# Patient Record
Sex: Male | Born: 1980 | Race: White | Hispanic: No | Marital: Married | Smoking: Never smoker
Health system: Southern US, Community
[De-identification: ages and names within clinical notes are randomized; demographics above are authoritative.]

## PROBLEM LIST (undated history)

## (undated) DIAGNOSIS — I480 Paroxysmal atrial fibrillation: Secondary | ICD-10-CM

## (undated) DIAGNOSIS — G4733 Obstructive sleep apnea (adult) (pediatric): Secondary | ICD-10-CM

## (undated) DIAGNOSIS — Z6832 Body mass index (BMI) 32.0-32.9, adult: Secondary | ICD-10-CM

## (undated) DIAGNOSIS — R9389 Abnormal findings on diagnostic imaging of other specified body structures: Secondary | ICD-10-CM

## (undated) DIAGNOSIS — R410 Disorientation, unspecified: Secondary | ICD-10-CM

## (undated) DIAGNOSIS — T7840XA Allergy, unspecified, initial encounter: Secondary | ICD-10-CM

## (undated) HISTORY — DX: Obstructive sleep apnea (adult) (pediatric): G47.33

## (undated) HISTORY — DX: Body mass index (BMI) 32.0-32.9, adult: Z68.32

## (undated) HISTORY — DX: Paroxysmal atrial fibrillation: I48.0

## (undated) HISTORY — PX: NO PAST SURGERIES: SHX2092

## (undated) HISTORY — DX: Allergy, unspecified, initial encounter: T78.40XA

## (undated) HISTORY — DX: Abnormal findings on diagnostic imaging of other specified body structures: R93.89

## (undated) HISTORY — DX: Disorientation, unspecified: R41.0

---

## 2017-07-04 ENCOUNTER — Other Ambulatory Visit: Payer: Self-pay | Admitting: Ophthalmology

## 2017-07-04 DIAGNOSIS — Q078 Other specified congenital malformations of nervous system: Secondary | ICD-10-CM

## 2017-07-15 ENCOUNTER — Ambulatory Visit
Admission: RE | Admit: 2017-07-15 | Discharge: 2017-07-15 | Disposition: A | Discharge status: Home / Self Care | Payer: 59 | Source: Ambulatory Visit | Attending: Ophthalmology | Admitting: Ophthalmology

## 2017-07-15 ENCOUNTER — Ambulatory Visit
Admission: RE | Admit: 2017-07-15 | Discharge: 2017-07-15 | Disposition: A | Discharge status: Home / Self Care | Payer: Self-pay | Source: Ambulatory Visit | Attending: Ophthalmology | Admitting: Ophthalmology

## 2017-07-15 DIAGNOSIS — Q078 Other specified congenital malformations of nervous system: Secondary | ICD-10-CM

## 2017-07-15 MED ORDER — GADOBENATE DIMEGLUMINE 529 MG/ML IV SOLN: 20.0000 mL | Freq: Once | INTRAVENOUS | Status: DC | PRN

## 2017-12-09 DIAGNOSIS — R5383 Other fatigue: Secondary | ICD-10-CM | POA: Insufficient documentation

## 2017-12-09 DIAGNOSIS — F419 Anxiety disorder, unspecified: Secondary | ICD-10-CM | POA: Insufficient documentation

## 2017-12-09 DIAGNOSIS — G479 Sleep disorder, unspecified: Secondary | ICD-10-CM

## 2019-01-09 ENCOUNTER — Other Ambulatory Visit: Payer: Self-pay

## 2019-01-09 ENCOUNTER — Institutional Professional Consult (permissible substitution): Payer: 59 | Admitting: Cardiothoracic Surgery

## 2019-01-09 ENCOUNTER — Encounter: Payer: 59 | Admitting: Cardiothoracic Surgery

## 2019-01-09 ENCOUNTER — Encounter: Payer: Self-pay | Admitting: Cardiothoracic Surgery

## 2019-01-09 DIAGNOSIS — Q248 Other specified congenital malformations of heart: Secondary | ICD-10-CM | POA: Diagnosis not present

## 2019-01-09 DIAGNOSIS — Q341 Congenital cyst of mediastinum: Secondary | ICD-10-CM | POA: Insufficient documentation

## 2019-01-09 HISTORY — DX: Congenital cyst of mediastinum: Q34.1

## 2019-01-09 NOTE — Progress Notes (Signed)
PCP is Everlean CherryWhyte, Thomas M, MD Referring Provider is Everlean CherryWhyte, Thomas M, MD  Chief Complaint  Patient presents with  . Cyst    new patient consultation, pericardial cyst CT abd/pelvis 12/13/2018  Patient examined, images of CT scan personally reviewed and shared with patient  HPI: 38 year old non-smoker good health recently had a CT scan of the abdomen for some left lower abdominal burning pain.  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  Otherwise cardiac structures appear to be normal on the abdominal window.  No other abdominal abnormalities.  The patient presents today for evaluation of the recently diagnosed pericardial cyst.  Patient denies any history of thoracic trauma or disease. 2014 the patient was hospitalized briefly at [facility] for palpitations due to an apparent atrial arrhythmia, atrial fibrillation.  He states he had an echocardiogram at that time which was reported as normal.  Since then he has had no problems with atrial fibrillation and he is not on any medications.  Patient denies any discomfort at all in the area of the right pericardial recess, the site of the cyst.  Patient is a non-smoker. Past Medical History:  Diagnosis Date  . Allergy   . Paroxysmal atrial fibrillation (HCC)     History reviewed. No pertinent surgical history.  Family History  Problem Relation Age of Onset  . Cancer Mother   . Early death Mother   . Hypertension Father   . Hyperlipidemia Father   . Cancer Maternal Aunt   . Heart disease Maternal Aunt   . Hypertension Maternal Grandmother     Social History Social History   Tobacco Use  . Smoking status: Never Smoker  . Smokeless tobacco: Never Used  Substance Use Topics  . Alcohol use: Yes    Comment: Rare, special occasion  . Drug use: Never    No current outpatient medications on file.   No current facility-administered medications for this visit.      No Known Allergies  Review of Systems       Right-hand-dominant    No chest pain or shortness of breath              Review of Systems :  [ y ] = yes, [  ] = no        General :  Weight gain [   ]    Weight loss  [   ]  Fatigue [  ]  Fever [  ]  Chills  [  ]                                          HEENT    Headache [  ]  Dizziness [  ]  Blurred vision [  ] Glaucoma  [  ]                          Nosebleeds [  ] Painful or loose teeth [  ]        Cardiac :  Chest pain/ pressure [  ]  Resting SOB [  ] exertional SOB [  ]                        Orthopnea [  ]  Pedal edema  [  ]  Palpitations [  ] Syncope/presyncope [ ]                         Paroxysmal nocturnal dyspnea [  ]         Pulmonary : cough [  ]  wheezing [  ]  Hemoptysis [  ] Sputum [  ] Snoring [  ]                              Pneumothorax [  ]  Sleep apnea [  ]        GI : Vomiting [  ]  Dysphagia [  ]  Melena  [  ]  Abdominal pain [y left lower abdomen] BRBPR [  ]              Heart burn [  ]  Constipation [  ] Diarrhea  [  ] Colonoscopy [   ]        GU : Hematuria [  ]  Dysuria [  ]  Nocturia [  ] UTI's [  ]        Vascular : Claudication [  ]  Rest pain [  ]  DVT [  ] Vein stripping [  ] leg ulcers [  ]                          TIA [  ] Stroke [  ]  Varicose veins [  ]        NEURO :  Headaches  [  ] Seizures [  ] Vision changes [  ] Paresthesias [  ]                                               Musculoskeletal :  Arthritis [  ] Gout  [  ]  Back pain [  ]  Joint pain [  ]        Skin :  Rash [  ]  Melanoma [  ] Sores [  ]        Heme : Bleeding problems [  ]Clotting Disorders [  ] Anemia [  ]Blood Transfusion [ ]         Endocrine : Diabetes [  ] Heat or Cold intolerance [  ] Polyuria [  ]excessive thirst [ ]         Psych : Depression [  ]  Anxiety [  ]  Psych hospitalizations [  ] Memory change [  ]                                                                            BP 119/84 (BP  Location: Right Arm, Patient Position: Sitting, Cuff Size: Large)   Pulse 84   Resp 18   Ht 5\' 8"  (1.727 m)   Wt 212 lb 6.4 oz (96.3 kg)   SpO2 99% Comment: RA  BMI 32.30 kg/m  Physical Exam      Physical Exam  General: Alert comfortable well-developed middle-aged male no acute distress HEENT: Normocephalic pupils equal , dentition adequate Neck: Supple without JVD, adenopathy, or bruit Chest: Clear to auscultation, symmetrical breath sounds, no rhonchi, no tenderness             or deformity Cardiovascular: Regular rate and rhythm, no murmur, no gallop, peripheral pulses             palpable in all extremities Abdomen:  Soft, nontender, no palpable mass or organomegaly Extremities: Warm, well-perfused, no clubbing cyanosis edema or tenderness,              no venous stasis changes of the legs Rectal/GU: Deferred Neuro: Grossly non--focal and symmetrical throughout Skin: Clean and dry without rash or ulceration   Diagnostic Tests: CT scan images show a smooth ovoid 4 cm cyst in the right pericardiophrenic angle.  This is a small pericardial cyst, asymptomatic.  Impression: Would not recommend resection of this small, benign, pericardial cyst.  Would recommend a follow-up CT scan next year to assess the pericardial cyst for any growth-increase in size.  Patient understands where the abnormality is and will let us know if becomes symptomatic.  Plan: Return with CT scan of chest in 1 year to follow-up small right pericardial cyst.  Mikey Bussing, MD [facility] [phone]

## 2019-01-10 ENCOUNTER — Encounter: Payer: 59 | Admitting: Cardiothoracic Surgery

## 2019-12-11 ENCOUNTER — Other Ambulatory Visit: Payer: Self-pay | Admitting: Cardiothoracic Surgery

## 2019-12-11 DIAGNOSIS — Q248 Other specified congenital malformations of heart: Secondary | ICD-10-CM

## 2020-01-23 ENCOUNTER — Ambulatory Visit: Payer: 59 | Admitting: Cardiothoracic Surgery

## 2020-01-23 ENCOUNTER — Other Ambulatory Visit: Payer: 59

## 2020-02-06 ENCOUNTER — Other Ambulatory Visit: Payer: Self-pay

## 2020-02-06 ENCOUNTER — Ambulatory Visit: Payer: 59 | Admitting: Cardiothoracic Surgery

## 2020-02-06 ENCOUNTER — Ambulatory Visit
Admission: RE | Admit: 2020-02-06 | Discharge: 2020-02-06 | Disposition: A | Discharge status: Home / Self Care | Payer: 59 | Source: Ambulatory Visit | Attending: Cardiothoracic Surgery | Admitting: Cardiothoracic Surgery

## 2020-02-06 ENCOUNTER — Encounter: Payer: Self-pay | Admitting: Cardiothoracic Surgery

## 2020-02-06 VITALS — BP 117/78 | HR 85 | Resp 20 | Ht 68.0 in | Wt 208.0 lb

## 2020-02-06 DIAGNOSIS — Q248 Other specified congenital malformations of heart: Secondary | ICD-10-CM | POA: Diagnosis not present

## 2020-02-06 MED ORDER — IOPAMIDOL (ISOVUE-300) INJECTION 61%
75.0000 mL | Freq: Once | INTRAVENOUS | Status: AC | PRN
  Administered 2020-02-06: 75 mL via INTRAVENOUS

## 2020-02-06 NOTE — Progress Notes (Signed)
PCP is Marylen Ponto, MD Referring Provider is Everlean Cherry, MD  Chief Complaint  Patient presents with  . Follow-up    1 year f/u with Chest CT surveillance on pericardial cyst    HPI: Patient returns for 1 year follow-up with CT scan of the chest for a benign pericardial cyst measuring approximately 5 cm in the right costophrenic region.  This was an incidental finding when he had a CT scan of the abdomen for abdominal complaints.  The cyst remains asymptomatic.  I reviewed the images of the CT scan personally and there is no change in size or morphology of the fluid-filled benign cyst.  He does not want the cyst removed if it is not causing problems.  It is not symptomatic and does not affect his lifestyle.   Past Medical History:  Diagnosis Date  . Allergy   . Paroxysmal atrial fibrillation (HCC)     History reviewed. No pertinent surgical history.  Family History  Problem Relation Age of Onset  . Cancer Mother   . Early death Mother   . Hypertension Father   . Hyperlipidemia Father   . Cancer Maternal Aunt   . Heart disease Maternal Aunt   . Hypertension Maternal Grandmother     Social History Social History   Tobacco Use  . Smoking status: Never Smoker  . Smokeless tobacco: Never Used  Substance Use Topics  . Alcohol use: Yes    Comment: Rare, special occasion  . Drug use: Never    Current Outpatient Medications  Medication Sig Dispense Refill  . ibuprofen (ADVIL) 800 MG tablet Take 800 mg by mouth 3 (three) times daily as needed.    . predniSONE (DELTASONE) 20 MG tablet Take 40 mg by mouth daily.    . sildenafil (REVATIO) 20 MG tablet SMARTSIG:2 Tablet(s) By Mouth As Needed     No current facility-administered medications for this visit.    No Known Allergies  Review of Systems  Negative as noted above No recent episodes of atrial fibrillation Some symptoms of bursitis in his right hip Some seasonal allergy symptoms No presyncope or headache or  change in vision No chest pain or shortness of breath No ankle edema or calf pain  BP 117/78   Pulse 85   Resp 20   Ht 5\' 8"  (1.727 m)   Wt 208 lb (94.3 kg)   SpO2 98% Comment: RA  BMI 31.63 kg/m  Physical Exam      Exam    General- alert and comfortable    Neck- no JVD, no cervical adenopathy palpable, no carotid bruit   Lungs- clear without rales, wheezes   Cor- regular rate and rhythm, no murmur , gallop   Abdomen- soft, non-tender   Extremities - warm, non-tender, minimal edema   Neuro- oriented, appropriate, no focal weakness   Diagnostic Tests: CT images personally reviewed showing no change in the 5 cm x 3 cm right pericardial cyst.  Lungs otherwise clear.  Impression: No indication for surgery.  We will continue to follow with serial CT scans.  Because of the stability over the past year will increase the scan intervals to 18 months.  He will call if he develops symptoms.  Plan: Would not recommend surgery for this benign cyst unless it becomes symptomatic or shows evidence of increase in size on serial scan. Return in 18 months with CT scan of chest.  [place], MD [facility] [phone]

## 2021-05-05 IMAGING — CT CT CHEST W/ CM
1 series · 15 of 33 positions shown, 19 images · IV contrast (APPLIED)
Comparison: Abdominal CT scan 12/13/2018

CLINICAL DATA: Followup pericardial cyst.

EXAM:
CT CHEST WITH CONTRAST
TECHNIQUE: Multidetector CT imaging of the chest was performed during
intravenous contrast administration.
CONTRAST:  75mL 4WWSDN-V22 IOPAMIDOL (4WWSDN-V22) INJECTION 61%

[Series 2: chest w/cm · axial · 0.72mm/px · z∈[-368,-46]mm · 15 of 189 slices shown, 19 images]
[im 14/189  mediastinal]
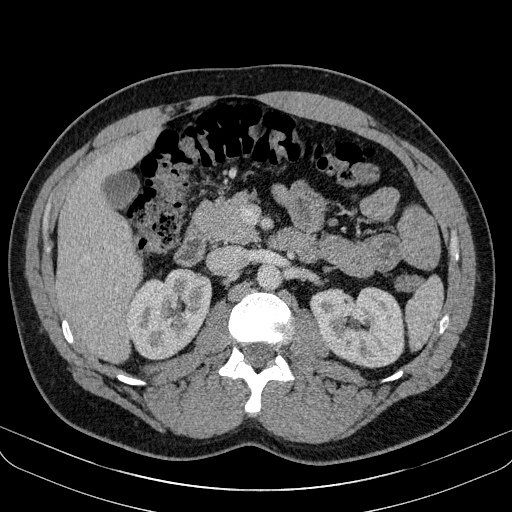
[im 14/189  lung]
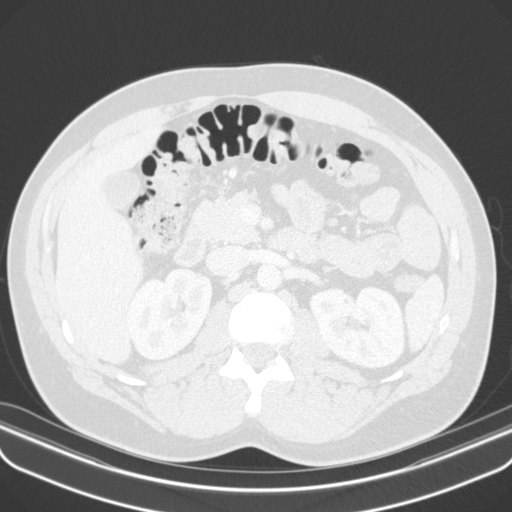
[im 28/189  lung]
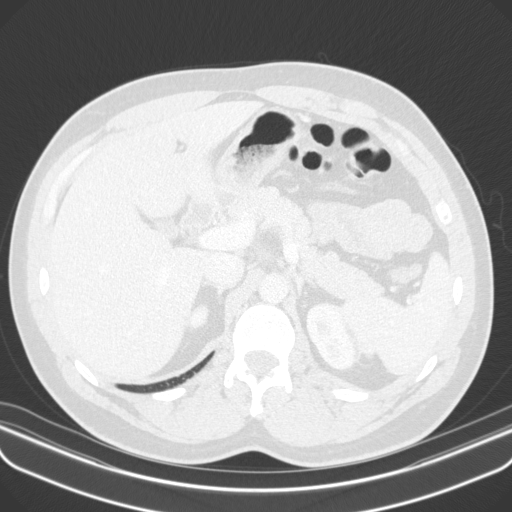
[im 38/189  lung]
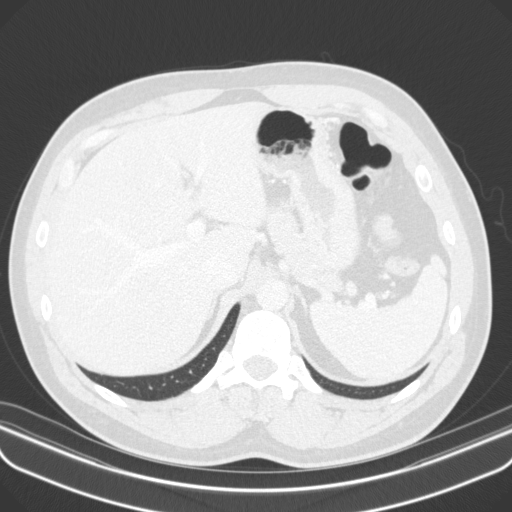
[im 49/189  lung]
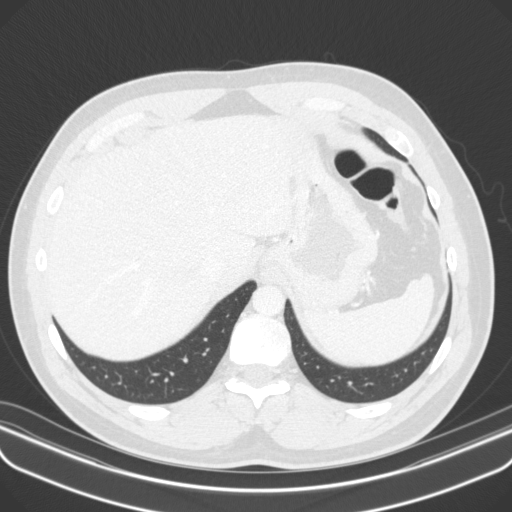
[im 63/189  mediastinal]
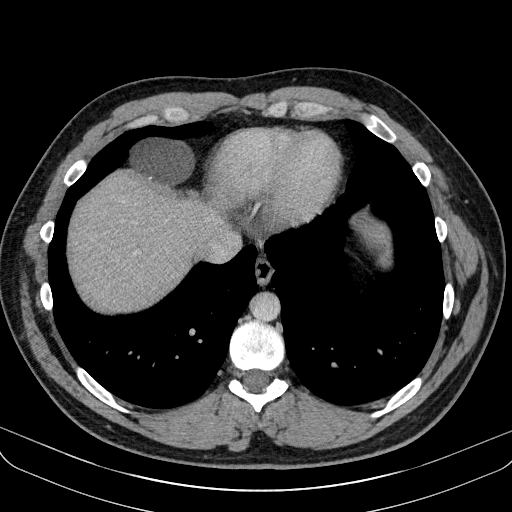
[im 63/189  lung]
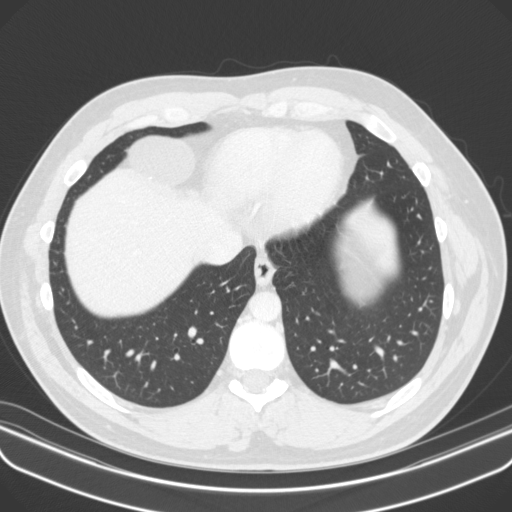
[im 76/189  lung]
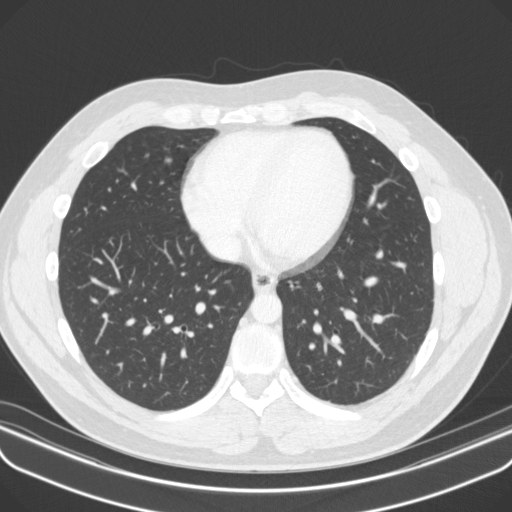
[im 84/189  lung]
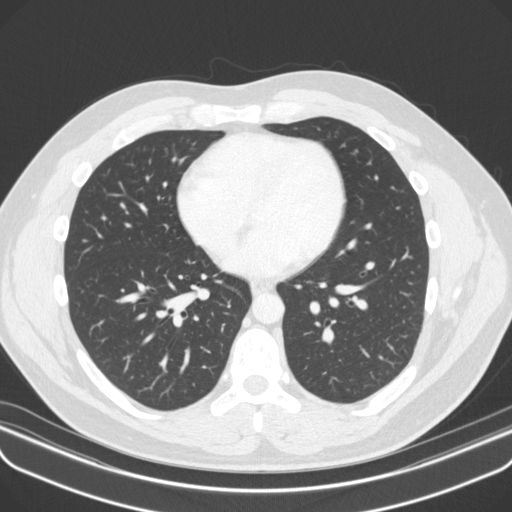
[im 98/189  lung]
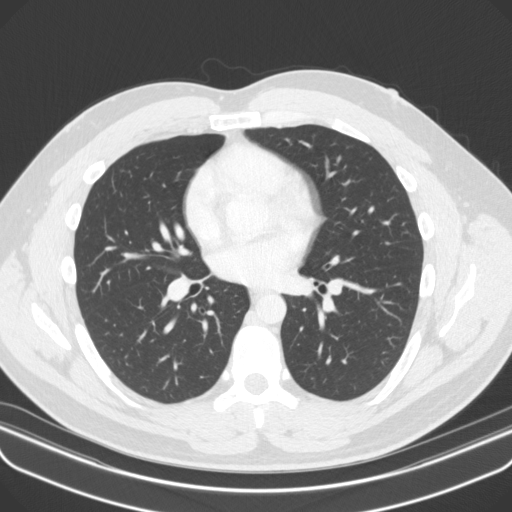
[im 105/189  mediastinal]
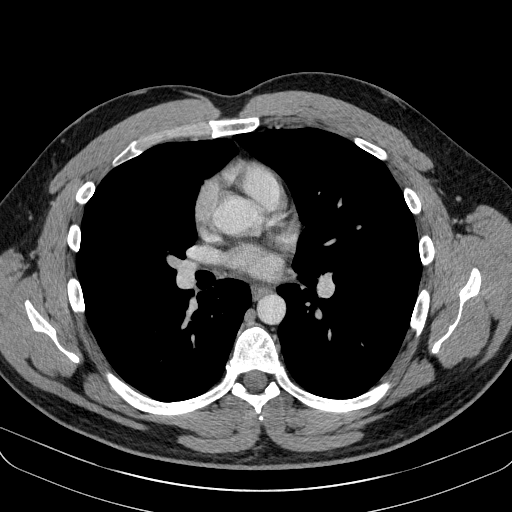
[im 105/189  lung]
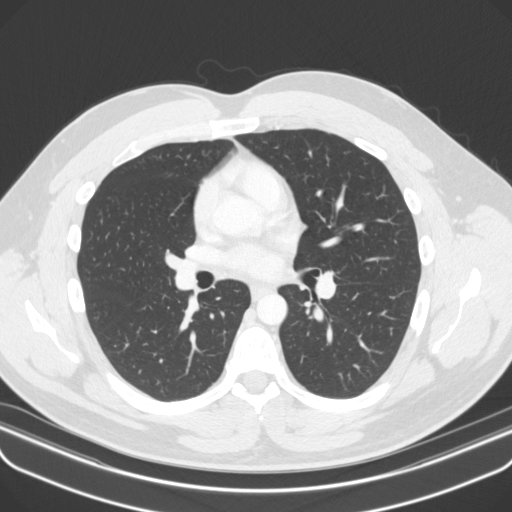
[im 113/189  lung]
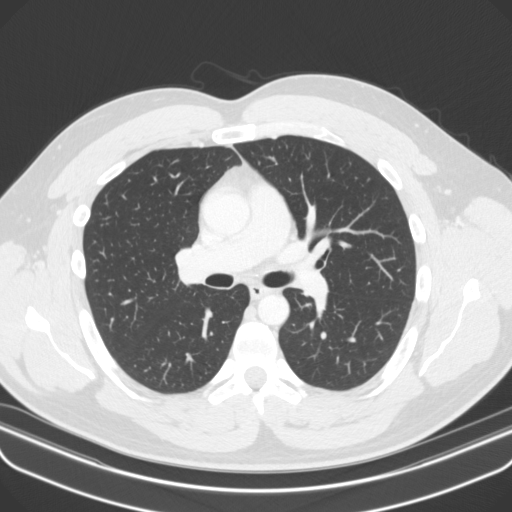
[im 126/189  lung]
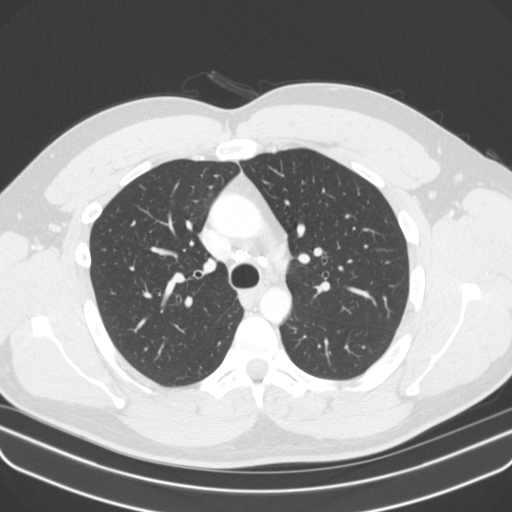
[im 140/189  lung]
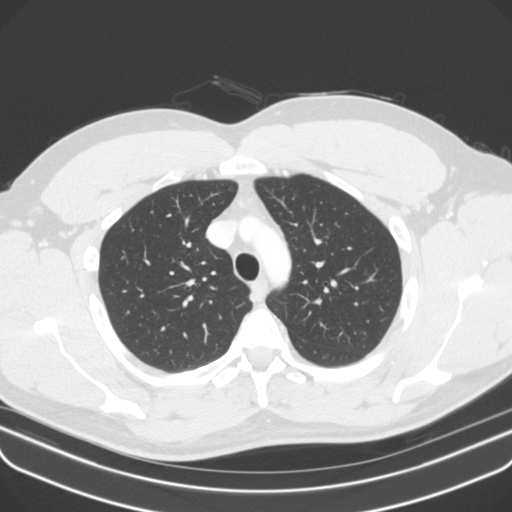
[im 151/189  mediastinal]
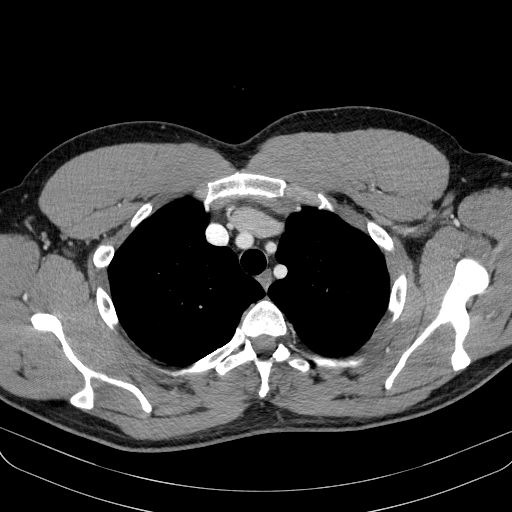
[im 151/189  lung]
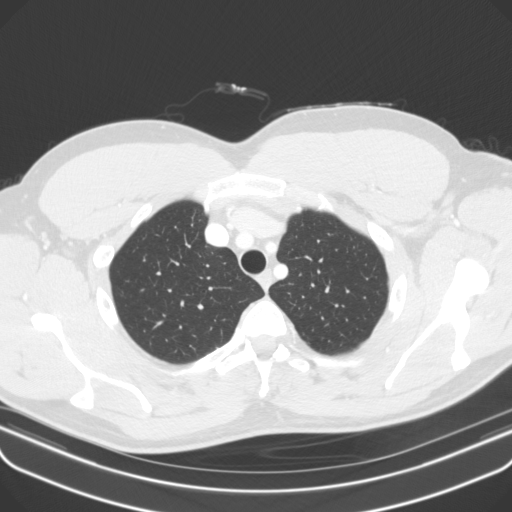
[im 161/189  lung]
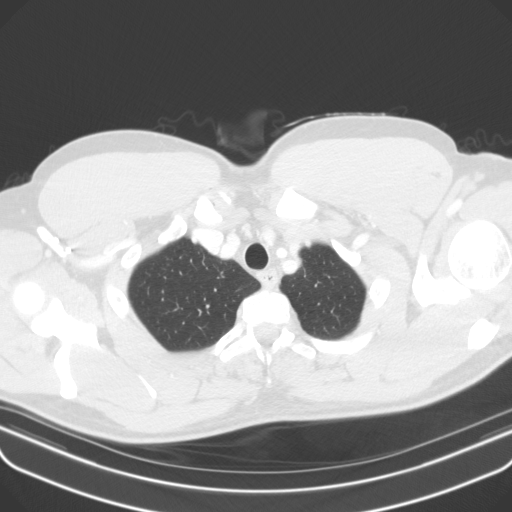
[im 175/189  lung]
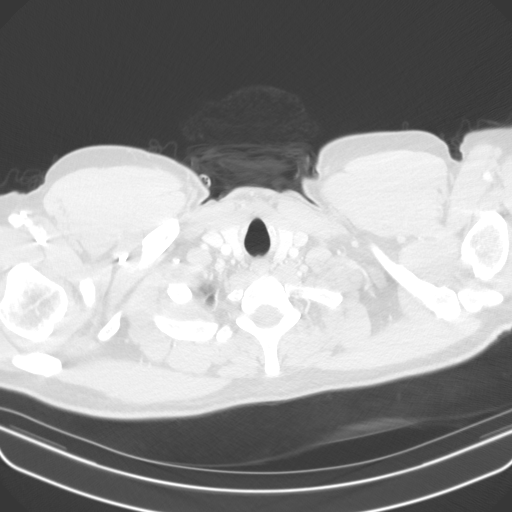

[15 of 33 positions shown; findings below may reference images not displayed]

FINDINGS: Cardiovascular: The heart is normal in size. No pericardial
effusion. The aorta is normal in caliber. No dissection. No
atherosclerotic calcifications. The branch vessels are patent. No
coronary artery calcifications.

Mediastinum/Nodes: Minimal residual thymic tissue noted in the
anterior mediastinum. There are calcified mediastinal lymph nodes.
No mediastinal or hilar mass or adenopathy. The esophagus is
unremarkable.

There is a stable cystic structure just above the right
hemidiaphragm near the right cardiophrenic angle. This measures a
maximum of 4.5 x 3.2 cm and previously measured approximately 4.5 x
3.1 cm. It measures as simple fluid. A few tiny peripheral
calcifications are noted. This is most likely a benign epicardial
cyst.

Lungs/Pleura: The lungs are clear. No acute pulmonary findings or
worrisome pulmonary lesions. No pleural effusion.

Upper Abdomen: No significant upper abdominal findings.

Musculoskeletal: No chest wall mass, supraclavicular or axillary
adenopathy. The thyroid gland appears normal.

The bony thorax is intact.
IMPRESSION: 1. Stable 4.5 x 3.2 cm cystic structure just above the right
hemidiaphragm, most likely a benign epicardial cyst.
2. No acute pulmonary findings or worrisome pulmonary lesions.
3. No mediastinal or hilar mass or adenopathy.
4. Normal vascular structures.

## 2021-07-01 ENCOUNTER — Other Ambulatory Visit: Payer: Self-pay | Admitting: *Deleted

## 2021-07-01 DIAGNOSIS — Q248 Other specified congenital malformations of heart: Secondary | ICD-10-CM

## 2021-08-11 NOTE — Progress Notes (Unsigned)
      [address]       [place] [address]             [phone]                    Worley Radermacher [facility] Medical Record #[id number] Date of Birth: 1981-01-27  Referring: Marylen Ponto, MD Primary Care: Marylen Ponto, MD Primary Cardiologist: None  Chief Complaint:   No chief complaint on file.   History of Present Illness:    Jesus Higgins 40 y.o. male ***    Past Medical History:  Diagnosis Date   Allergy    Paroxysmal atrial fibrillation (HCC)     No past surgical history on file.  Family History  Problem Relation Age of Onset   Cancer Mother    Early death Mother    Hypertension Father    Hyperlipidemia Father    Cancer Maternal Aunt    Heart disease Maternal Aunt    Hypertension Maternal Grandmother      Social History   Tobacco Use  Smoking Status Never  Smokeless Tobacco Never    Social History   Substance and Sexual Activity  Alcohol Use Yes   Comment: Rare, special occasion     No Known Allergies  Current Outpatient Medications  Medication Sig Dispense Refill   ibuprofen (ADVIL) 800 MG tablet Take 800 mg by mouth 3 (three) times daily as needed.     No current facility-administered medications for this visit.    ROS  PHYSICAL EXAMINATION: There were no vitals taken for this visit.  Physical Exam   Diagnostic Studies & Laboratory data:     Recent Radiology Findings:   No results found.     I have independently reviewed the above radiology studies  and reviewed the findings with the patient.   Recent Lab Findings: No results found for: WBC, HGB, HCT, PLT, GLUCOSE, CHOL, TRIG, HDL, LDLDIRECT, LDLCALC, ALT, AST, NA, K, CL, CREATININE, BUN, CO2, TSH, INR, GLUF, HGBA1C     Problem List: Right pericarial cyst  Assessment / Plan:   40 yo male followed by Dr. Donata Clay for a pericardial cyst.   If stable, no need for surgery, unless pt is symptomatic      [name] 08/11/2021 7:50  AM

## 2021-08-14 ENCOUNTER — Encounter: Payer: Self-pay | Admitting: Thoracic Surgery (Cardiothoracic Vascular Surgery)

## 2021-08-14 ENCOUNTER — Inpatient Hospital Stay: Admission: RE | Admit: 2021-08-14 | Payer: Managed Care, Other (non HMO) | Source: Ambulatory Visit

## 2021-11-06 DIAGNOSIS — Z6832 Body mass index (BMI) 32.0-32.9, adult: Secondary | ICD-10-CM | POA: Diagnosis not present

## 2021-11-06 DIAGNOSIS — Z1331 Encounter for screening for depression: Secondary | ICD-10-CM | POA: Diagnosis not present

## 2021-11-06 DIAGNOSIS — R410 Disorientation, unspecified: Secondary | ICD-10-CM | POA: Diagnosis not present

## 2021-11-06 DIAGNOSIS — Z0000 Encounter for general adult medical examination without abnormal findings: Secondary | ICD-10-CM | POA: Diagnosis not present

## 2021-11-06 DIAGNOSIS — Z20828 Contact with and (suspected) exposure to other viral communicable diseases: Secondary | ICD-10-CM | POA: Diagnosis not present

## 2021-11-16 DIAGNOSIS — R410 Disorientation, unspecified: Secondary | ICD-10-CM | POA: Diagnosis not present

## 2021-11-16 DIAGNOSIS — J3489 Other specified disorders of nose and nasal sinuses: Secondary | ICD-10-CM | POA: Diagnosis not present

## 2021-11-28 ENCOUNTER — Encounter (HOSPITAL_COMMUNITY): Payer: Self-pay | Admitting: Emergency Medicine

## 2021-11-28 ENCOUNTER — Emergency Department (HOSPITAL_COMMUNITY)
Admission: EM | Admit: 2021-11-28 | Discharge: 2021-11-28 | Disposition: A | Discharge status: Home / Self Care | Payer: BC Managed Care – PPO | Attending: Student | Admitting: Student

## 2021-11-28 ENCOUNTER — Other Ambulatory Visit: Payer: Self-pay

## 2021-11-28 ENCOUNTER — Emergency Department (HOSPITAL_COMMUNITY): Payer: BC Managed Care – PPO

## 2021-11-28 DIAGNOSIS — Z5321 Procedure and treatment not carried out due to patient leaving prior to being seen by health care provider: Secondary | ICD-10-CM | POA: Insufficient documentation

## 2021-11-28 DIAGNOSIS — R42 Dizziness and giddiness: Secondary | ICD-10-CM | POA: Diagnosis not present

## 2021-11-28 DIAGNOSIS — R0789 Other chest pain: Secondary | ICD-10-CM | POA: Diagnosis not present

## 2021-11-28 DIAGNOSIS — R0602 Shortness of breath: Secondary | ICD-10-CM | POA: Insufficient documentation

## 2021-11-28 DIAGNOSIS — R079 Chest pain, unspecified: Secondary | ICD-10-CM | POA: Diagnosis not present

## 2021-11-28 DIAGNOSIS — R410 Disorientation, unspecified: Secondary | ICD-10-CM | POA: Insufficient documentation

## 2021-11-28 LAB — CBC WITH DIFFERENTIAL/PLATELET
Abs Immature Granulocytes: 0.1 10*3/uL — ABNORMAL HIGH (ref 0.00–0.07)
Basophils Absolute: 0.1 10*3/uL (ref 0.0–0.1)
Basophils Relative: 1 %
Eosinophils Absolute: 0.2 10*3/uL (ref 0.0–0.5)
Eosinophils Relative: 2 %
HCT: 39.5 % (ref 39.0–52.0)
Hemoglobin: 13.7 g/dL (ref 13.0–17.0)
Immature Granulocytes: 1 %
Lymphocytes Relative: 30 %
Lymphs Abs: 2.6 10*3/uL (ref 0.7–4.0)
MCH: 30.6 pg (ref 26.0–34.0)
MCHC: 34.7 g/dL (ref 30.0–36.0)
MCV: 88.2 fL (ref 80.0–100.0)
Monocytes Absolute: 0.6 10*3/uL (ref 0.1–1.0)
Monocytes Relative: 7 %
Neutro Abs: 5.2 10*3/uL (ref 1.7–7.7)
Neutrophils Relative %: 59 %
Platelets: 285 10*3/uL (ref 150–400)
RBC: 4.48 MIL/uL (ref 4.22–5.81)
RDW: 12.5 % (ref 11.5–15.5)
WBC: 8.7 10*3/uL (ref 4.0–10.5)
nRBC: 0 % (ref 0.0–0.2)

## 2021-11-28 LAB — BASIC METABOLIC PANEL
Anion gap: 5 (ref 5–15)
BUN: 19 mg/dL (ref 6–20)
CO2: 29 mmol/L (ref 22–32)
Calcium: 8.7 mg/dL — ABNORMAL LOW (ref 8.9–10.3)
Chloride: 102 mmol/L (ref 98–111)
Creatinine, Ser: 0.93 mg/dL (ref 0.61–1.24)
GFR, Estimated: >60 mL/min (ref >60)
Glucose, Bld: 95 mg/dL (ref 70–99)
Potassium: 3.8 mmol/L (ref 3.5–5.1)
Sodium: 136 mmol/L (ref 135–145)

## 2021-11-28 LAB — TROPONIN I (HIGH SENSITIVITY): Troponin I (High Sensitivity): <2 ng/L (ref <18)

## 2021-11-28 NOTE — ED Triage Notes (Signed)
Pt reports having a cyst in central chest since 2018. Pt reports being dizzy, confused, lightheaded, and SHOB more often. Pt report discomfort in chest yesterday.

## 2021-11-28 NOTE — ED Provider Notes (Signed)
Emergency Medicine Provider Triage Evaluation Note  Jesus Higgins , a 40 y.o. male  was evaluated in triage.  Pt with hx of mediastinal cyst who presents with concern for 24 hours of chest discomfort and SOB, lightheadedness.  Additionally has history of some MRI changes in the brain with MRI last week, following with [facility] neurology for ongoing chronic confusion.  This is not seem to be exacerbated today according to the patient  Review of Systems  Positive: Shortness of breath, lightheadedness, chest discomfort Negative: Syncope, focal weakness  Physical Exam  BP (!) 158/86 (BP Location: Left Arm)    Pulse 85    Resp (!) 105    SpO2 99%  Gen:   Awake, no distress   Resp:  Normal effort  MSK:   Moves extremities without difficulty  Other:  RRR no M/R/G.  Lungs CTA B.  PERRL, EOMI  Medical Decision Making  Medically screening exam initiated at 2:55 PM.  Appropriate orders placed.  Sujay Grundman was informed that the remainder of the evaluation will be completed by another provider, this initial triage assessment does not replace that evaluation, and the importance of remaining in the ED until their evaluation is complete.  This chart was dictated using voice recognition software, Dragon. Despite the best efforts of this provider to proofread and correct errors, errors may still occur which can change documentation meaning.    Paris Lore, PA-C 11/28/21 1457    Mancel Bale, MD 11/28/21 2232

## 2021-11-28 NOTE — ED Notes (Signed)
Patient transported to X-ray 

## 2021-12-09 DIAGNOSIS — I898 Other specified noninfective disorders of lymphatic vessels and lymph nodes: Secondary | ICD-10-CM | POA: Diagnosis not present

## 2021-12-09 DIAGNOSIS — Q248 Other specified congenital malformations of heart: Secondary | ICD-10-CM | POA: Diagnosis not present

## 2021-12-09 DIAGNOSIS — J9859 Other diseases of mediastinum, not elsewhere classified: Secondary | ICD-10-CM | POA: Diagnosis not present

## 2021-12-09 DIAGNOSIS — I318 Other specified diseases of pericardium: Secondary | ICD-10-CM | POA: Diagnosis not present

## 2021-12-09 DIAGNOSIS — R222 Localized swelling, mass and lump, trunk: Secondary | ICD-10-CM | POA: Diagnosis not present

## 2021-12-22 ENCOUNTER — Encounter: Payer: Self-pay | Admitting: *Deleted

## 2021-12-23 ENCOUNTER — Encounter: Payer: Self-pay | Admitting: Psychiatry

## 2021-12-23 ENCOUNTER — Ambulatory Visit (INDEPENDENT_AMBULATORY_CARE_PROVIDER_SITE_OTHER): Payer: BC Managed Care – PPO | Admitting: Psychiatry

## 2021-12-23 VITALS — BP 144/90 | HR 80 | Ht 68.0 in | Wt 220.0 lb

## 2021-12-23 DIAGNOSIS — F419 Anxiety disorder, unspecified: Secondary | ICD-10-CM

## 2021-12-23 DIAGNOSIS — R0683 Snoring: Secondary | ICD-10-CM | POA: Diagnosis not present

## 2021-12-23 DIAGNOSIS — R413 Other amnesia: Secondary | ICD-10-CM | POA: Diagnosis not present

## 2021-12-23 DIAGNOSIS — R4182 Altered mental status, unspecified: Secondary | ICD-10-CM

## 2021-12-23 NOTE — Progress Notes (Signed)
[facility]  PATIENT: Jesus Higgins DOB: 02-14-1981  REFERRING CLINICIAN: Ronita Hipps, MD HISTORY FROM: self and wife REASON FOR VISIT: memory loss   HISTORICAL  CHIEF COMPLAINT:  Chief Complaint  Patient presents with   Memory Loss    RM 1 with spouse [name]  Pt is well,  has been having some confusion/altered memory for about 6 months.  States he forgets more so short term tings, spouse states he is having dizziness and imbalance as well.     HISTORY OF PRESENT ILLNESS:  The patient presents for evaluation of memory loss which has been present over the past 6 months. He cannot identify any trigger for onset of symptoms, though PCP documentation notes he had a COVID infection around this time. Short term memory is most significantly affected. He has good days and bad days. Will have episodes of confusion and forgetfulness which last 1-2 days at a time. Feels like these episodes are getting more frequent. Memory issues are exacerbated by fatigue.   Forgot when it was Christmas Eve last year. He will repeat questions frequently and forget to pick things up from the grocery stores. Has gone to work on his day off because he forgot he was off that day. He is writing everything down that he needs to do for that day so he doesn't forget.  He has also developed lightheadedness, imbalance, and headaches. Lightheadedness usually occurs when he is standing. Headaches are occurring every day and typically resolve with ibuprofen. Denies photophobia and phonophobia, or nausea. He would get intermittent headaches before this, but nothing this frequent.  Had an MRI in 2018 was done for progressively worsening vision. This showed bilateral optic nerve atrophy and bilateral periatrial white matter changes. He was told nothing could be done about the vision/nerve atrophy. Repeat MRI in December 2022 was stable without new hyperintensities or optic nerve changes.   TBI:  Hit  his head 25 years ago and needed stitches Stroke:  no past history of stroke Seizures:  no past history of seizures Sleep: Trouble staying asleep at night due to pain at night. Snores at night. Does not wake up gasping for air.Will doze off during the day. Mood: He has increased anxiety and panic attacks. 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 Never sought treatment for this.  Functional status:  Patient lives with his husband and two children Cooking: He's been cooking the same thing every day, has forgotten the stove was on Cleaning: Cleans around the house without issues Shopping: Forgets to pick things up while shopping Driving: Has not gotten lost while driving, no car accidents Bills: Wife handles finances Medications: Does not take any prescription medications Ever left the stove on by accident?: yes Forget how to use items around the house?: no Getting lost going to familiar places?: no Forgetting loved ones names?: no Word finding difficulty? yes  OTHER MEDICAL CONDITIONS: anxiety, afib not on AC, hx of bilateral optic nerve atrophy   REVIEW OF SYSTEMS: Full 14 system review of systems performed and negative with exception of: memory loss, imbalance, dizziness, headaches  ALLERGIES: No Known Allergies  HOME MEDICATIONS: Outpatient Medications Prior to Visit  Medication Sig Dispense Refill   ibuprofen (ADVIL) 800 MG tablet Take 800 mg by mouth 3 (three) times daily as needed.     No facility-administered medications prior to visit.    PAST MEDICAL HISTORY: Past Medical History:  Diagnosis Date   Abnormal MRI  Allergy    Confusion    Paroxysmal atrial fibrillation (HCC)     PAST SURGICAL HISTORY: History reviewed. No pertinent surgical history.  FAMILY HISTORY: Family History  Problem Relation Age of Onset   Cancer Mother        breast   Early death Mother    Hypertension Father    Hyperlipidemia Father    Hypertension Maternal  Grandmother    Cancer Maternal Aunt    Heart disease Maternal 66   Grandparents had dementia Aunt had strokes  SOCIAL HISTORY: Social History   Socioeconomic History   Marital status: Married    Spouse name: Not on file   Number of children: 2   Years of education: Not on file   Highest education level: Not on file  Occupational History   Not on file  Tobacco Use   Smoking status: Never   Smokeless tobacco: Never  Vaping Use   Vaping Use: Never used  Substance and Sexual Activity   Alcohol use: Yes    Comment: Rare, special occasion   Drug use: Never   Sexual activity: Not on file  Other Topics Concern   Not on file  Social History Narrative   Lives with family   Social Determinants of Health   Financial Resource Strain: Not on file  Food Insecurity: Not on file  Transportation Needs: Not on file  Physical Activity: Not on file  Stress: Not on file  Social Connections: Not on file  Intimate Partner Violence: Not on file     PHYSICAL EXAM  GENERAL EXAM/CONSTITUTIONAL: Vitals:  Vitals:   12/23/21 0817  BP: (!) 144/90  Pulse: 80  Weight: 220 lb (99.8 kg)  Height: 5\' 8"  (1.727 m)   Body mass index is 33.45 kg/m. Wt Readings from Last 3 Encounters:  12/23/21 220 lb (99.8 kg)  02/06/20 208 lb (94.3 kg)  01/09/19 212 lb 6.4 oz (96.3 kg)   Patient is in no distress; well developed, nourished and groomed; neck is supple  CARDIOVASCULAR: Examination of peripheral vascular system by observation and palpation is normal  EYES: Pupils round and reactive to light, Visual fields full to confrontation, Extraocular movements intact   MUSCULOSKELETAL: Gait, strength, tone, movements noted in Neurologic exam below  NEUROLOGIC: MENTAL STATUS:  MMSE - [place] Exam 12/23/2021  Orientation to time 5  Orientation to Place 5  Registration 3  Attention/ Calculation 5  Recall 3  Language- name 2 objects 2  Language- repeat 1  Language- follow 3 step  command 3  Language- read & follow direction 1  Write a sentence 1  Copy design 1  Total score 30    CRANIAL NERVE:  2nd, 3rd, 4th, 6th - pupils equal and reactive to light, visual fields full to confrontation, extraocular muscles intact, no nystagmus 5th - facial sensation symmetric 7th - facial strength symmetric 8th - hearing intact 9th - palate elevates symmetrically, uvula midline 11th - shoulder shrug symmetric 12th - tongue protrusion midline  MOTOR:  normal bulk and tone, no cogwheeling, full strength in the BUE, BLE  SENSORY:  normal and symmetric to light touch all 4 extremities  COORDINATION:  finger-nose-finger, fine finger movements normal, no tremor  REFLEXES:  deep tendon reflexes present and symmetric  GAIT/STATION:  normal     DIAGNOSTIC DATA (LABS, IMAGING, TESTING) - I reviewed patient records, labs, notes, testing and imaging myself where available.  Lab Results  Component Value Date   WBC 8.7 11/28/2021  HGB 13.7 11/28/2021   HCT 39.5 11/28/2021   MCV 88.2 11/28/2021   PLT 285 11/28/2021      Component Value Date/Time   NA 136 11/28/2021 1511   K 3.8 11/28/2021 1511   CL 102 11/28/2021 1511   CO2 29 11/28/2021 1511   GLUCOSE 95 11/28/2021 1511   BUN 19 11/28/2021 1511   CREATININE 0.93 11/28/2021 1511   CALCIUM 8.7 (L) 11/28/2021 1511   GFRNONAA >60 11/28/2021 1511   MRI brain/orbits 2018: Mild periventricular white matter hyperintensity bilaterally which is symmetric. This has been described with optic nerve atrophy. This may be due to chronic ischemia, possibly perinatal ischemia. No acute intracranial abnormality.   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 No mass lesion. Findings suggest optic nerve atrophy bilaterally.  Imaging personally reviewed on 12/23/21   MRI brain 2022:  Stable from 2018 MRI (per report only)  ASSESSMENT AND PLAN  41 y.o. year old male with a history of anxiety, afib not on AC,  bilateral optic nerve atrophy who presents for evaluation of memory and concentration issues over the past 6 months. MRI brain is stable from prior imaging in 2018 with no new findings to explain his symptoms. Will check blood work for reversible causes of memory loss today. Will also check EEG for subclinical seizures as his cognitive changes are episodic with return to baseline in between episodes.   Discussed how mood and sleep issues can contribute to memory loss. Referral to Sleep team placed for evaluation of possible OSA as he endorses snoring at night and daytime fatigue. Psychology referral placed to help with his anxiety.   1. Snoring   2. Anxiety   3. Memory loss   4. Altered mental status, unspecified altered mental status type      PLAN: - Labs: TSH, B12 - Routine EEG - Referral to Sleep for OSA evaluation - Referral to Psychology for anxiety - next steps: consider neuropsych testing if workup unrevealing  Orders Placed This Encounter  Procedures   Vitamin B12   TSH   Ambulatory referral to Neurology   Ambulatory referral to Psychology   EEG adult    No orders of the defined types were placed in this encounter.   Return in about 6 months (around 06/22/2022).  I spent an average of 65 minutes chart reviewing and counseling the patient, with at least 50% of the time face to face with the patient. General brain health measures discussed, including the importance of regular aerobic exercise. Reviewed safety measures including driving safety.   Genia Harold, MD 12/23/20 12:27 PM  [facility] [address] [place], [place] [address] [phone]

## 2021-12-23 NOTE — Patient Instructions (Addendum)
Referral to Sleep for sleep apnea evaluation Referral to psychology for anxiety Blood work - Vitamin B12, TSH 20 minute EEG

## 2021-12-24 LAB — TSH: TSH: 2.67 u[IU]/mL (ref 0.450–4.500)

## 2021-12-24 LAB — VITAMIN B12: Vitamin B-12: 472 pg/mL (ref 232–1245)

## 2021-12-28 ENCOUNTER — Ambulatory Visit (INDEPENDENT_AMBULATORY_CARE_PROVIDER_SITE_OTHER): Payer: BC Managed Care – PPO | Admitting: Neurology

## 2021-12-28 ENCOUNTER — Other Ambulatory Visit: Payer: Self-pay

## 2021-12-28 DIAGNOSIS — R4182 Altered mental status, unspecified: Secondary | ICD-10-CM

## 2021-12-28 NOTE — Procedures (Signed)
° ° °  History:  65 yom with confusion and altered mental status   EEG classification: Awake and drowsy  Description of the recording: The background rhythms of this recording consists of a fairly well modulated medium amplitude alpha rhythm of 11-12 Hz that is reactive to eye opening and closure. As the record progresses, the patient appears to remain in the waking state throughout the recording. Photic stimulation was performed, did not show any abnormalities. Hyperventilation was also performed, did not show any abnormalities. Toward the end of the recording, the patient enters the drowsy state with slight symmetric slowing seen. The patient never enters stage II sleep. There are left temporal sharps and slow waves seen during this recording. There was  left temporal focal slowing. EKG monitor shows no evidence of cardiac rhythm abnormalities with a heart rate of 66.  Impression: This is an abnormal EEG recording in the waking and drowsy state due to:  Left temporal sharp and slow wave  Left temporal focal slowing.   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 No seizures were captured during this recording.    Windell Norfolk, MD [facility]

## 2021-12-29 ENCOUNTER — Other Ambulatory Visit: Payer: Self-pay | Admitting: Psychiatry

## 2021-12-29 MED ORDER — LEVETIRACETAM 500 MG PO TABS: 500.0000 mg | ORAL_TABLET | Freq: Two times a day (BID) | ORAL | 3 refills | Status: DC

## 2021-12-29 NOTE — Progress Notes (Signed)
Called patient to discuss EEG results. 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 Will start Keppra 500 mg BID and see if this reduces his confusional episodes. He will try to get CDs of his most recent MRI to bring to his next visit. Discussed possible LP to assess for underlying causes of new onset seizures and cognitive changes. He would prefer to try medication first, then consider LP if symptoms worsen or do not improve with AEDs.  Victorino Dike Bruin Bolger 12/29/21 12:38 PM

## 2022-01-05 ENCOUNTER — Telehealth: Payer: Self-pay | Admitting: *Deleted

## 2022-01-05 NOTE — Telephone Encounter (Signed)
I looked over his MRI CD and it looks stable from his prior MRI in 2018

## 2022-01-05 NOTE — Telephone Encounter (Signed)
Called patient and informed him his recent MRI is stable from 2018.  He asked about LP. I informed him per her note he wanted to try Keppra first and then if no improvement or worsening of symptoms she would consider LP. He will take keppra for a month then call back to update [place] with his status. I advised his I'll let Dr Delena Bali know of his plan.  Patient verbalized understanding, appreciation.

## 2022-01-05 NOTE — Telephone Encounter (Signed)
Received CD of MRI from patient. Placed on Dr Quentin Mulling desk for review.

## 2022-01-11 ENCOUNTER — Telehealth: Payer: Self-pay | Admitting: Neurology

## 2022-01-11 ENCOUNTER — Ambulatory Visit (INDEPENDENT_AMBULATORY_CARE_PROVIDER_SITE_OTHER): Payer: BC Managed Care – PPO | Admitting: Neurology

## 2022-01-11 ENCOUNTER — Encounter: Payer: Self-pay | Admitting: Neurology

## 2022-01-11 VITALS — BP 129/84 | HR 78 | Ht 68.0 in | Wt 221.0 lb

## 2022-01-11 DIAGNOSIS — G478 Other sleep disorders: Secondary | ICD-10-CM

## 2022-01-11 DIAGNOSIS — R413 Other amnesia: Secondary | ICD-10-CM | POA: Diagnosis not present

## 2022-01-11 DIAGNOSIS — R0683 Snoring: Secondary | ICD-10-CM | POA: Diagnosis not present

## 2022-01-11 DIAGNOSIS — G44229 Chronic tension-type headache, not intractable: Secondary | ICD-10-CM

## 2022-01-11 DIAGNOSIS — H4720 Unspecified optic atrophy: Secondary | ICD-10-CM | POA: Diagnosis not present

## 2022-01-11 NOTE — Addendum Note (Signed)
Addended by: Melvyn Novas on: 01/11/2022 12:29 PM   Modules accepted: Orders

## 2022-01-11 NOTE — Telephone Encounter (Signed)
Referral sent to [facility] [phone]. ?

## 2022-01-11 NOTE — Progress Notes (Addendum)
SLEEP MEDICINE CLINIC    Provider:  Melvyn Novasarmen  Ryen Rhames, MD  Primary Care Physician:  Marylen PontoHolt, Lynley S, MD [address] [place] [phone]     Referring Provider: Ocie Doynehima, Jennifer, Md [address], [address] [place],  [place] [id number]          Chief Complaint according to patient   Patient presents with:     New Patient (Initial Visit)           HISTORY OF PRESENT ILLNESS:  Jesus Higgins is a 41 year -old  male patient  of sic[place] origin , seen seen here  on 01/11/2022 from Dr Delena Balihima for a sleep evaluation. he has balance, memory and fatigue issues.   Chief concern according to patient :  Pt referred by Dr. Delena Balihima for snoring, daytime fatigue and memory loss. Ongoing snoring for years when sleeping on back. No prior SS done. Father has OSA and uses CPAP.  He does not snore when not supine. Wife reports him asleep quickly. He feels very sore and wakes up frequently out of sleep, aching, not able to sleep through the night while not having trouble to initiate sleep     Jesus Higgins  has a past medical history of Abnormal MRI, Allergy, Confusion, and Paroxysmal atrial fibrillation (HCC).   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   Family medical /sleep history: he has a  family member on CPAP with OSA. Marland Kitchen.    Social history:  Patient is working as a Emergency planning/management officerpolice officer. and lives with wife Editor, commissioning( RN ) and [id number] and 42 year old child. 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  Tobacco use-never .   ETOH use - none since 2022.  Caffeine intake in form of Coffee( 16 ounces of coffee in AM ) Soda( 1-2 / week ) or energy drinks. Regular exercise in form of sports- running .     Sleep habits are as follows: The patient's dinner time is between 6-7  PM. The patient goes to bed at 10 PM and continues to sleep for 6 hours, wakes  for one or two bathroom breaks, the first time at 2 AM.   The preferred sleep position is sideways, with the support of 1-2 pillows.  Dreams are reportedly frequent.  5  AM is the usual rise time. The patient wakes up spontaneously before his alarm.  He reports not feeling refreshed or restored in AM, with symptoms such as dry mouth, some morning headaches, and residual fatigue.  Naps are not  taken.  Review of Systems: Out of a complete 14 system review, the patient complains of only the following symptoms, and all other reviewed systems are negative.:  Fatigue, sleepiness , snoring, fragmented sleep,   Impression: This is an abnormal EEG recording in the waking and drowsy state due to:  Left temporal sharp and slow wave  Left temporal focal slowing.    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 No seizures were captured during this recording.     How likely are you to doze in the following situations: 0 = not likely, 1 = slight chance, 2 = moderate chance, 3 = high chance   Sitting and Reading?1 Watching Television?2 Sitting inactive in  a public place (theater or meeting)?1 As a passenger in a car for an hour without a break?1 Lying down in the afternoon when circumstances permit?2 Sitting and talking to someone?0 Sitting quietly after lunch without alcohol?1 In a car, while stopped for a few minutes in traffic?0   Total = 8/ 24 points   FSS endorsed at na/ 63 points.   Social History   Socioeconomic History   Marital status: Married    Spouse name: Melanie   Number of children: 2   Years of education: Not on file   Highest education level: Not on file  Occupational History   Not on file  Tobacco Use   Smoking status: Never   Smokeless tobacco: Never  Vaping Use   Vaping Use: Never used  Substance and Sexual Activity   Alcohol use: Yes     Comment: Rare, special occasion   Drug use: Never   Sexual activity: Not on file  Other Topics Concern   Not on file  Social History Narrative   Lives with family   Right handed   Caffeine: 2 cup of coffee in a day, energy drink 1-2x a week.    Social Determinants of Health   Financial Resource Strain: Not on file  Food Insecurity: Not on file  Transportation Needs: Not on file  Physical Activity: Not on file  Stress: Not on file  Social Connections: Not on file    Family History  Problem Relation Age of Onset   Cancer Mother        breast   Early death Mother    Hypertension Father    Hyperlipidemia Father    Hypertension Maternal Grandmother    Cancer Maternal Aunt    Heart disease Maternal Aunt     Past Medical History:  Diagnosis Date   Abnormal MRI    Allergy    Confusion    Paroxysmal atrial fibrillation (HCC)     History reviewed. No pertinent surgical history.   Current Outpatient Medications on File Prior to Visit  Medication Sig Dispense Refill   ibuprofen (ADVIL) 800 MG tablet Take 800 mg by mouth 3 (three) times daily as needed.     levETIRAcetam (KEPPRA) 500 MG tablet Take 1 tablet (500 mg total) by mouth 2 (two) times daily. 60 tablet 3   No current facility-administered medications on file prior to visit.    No Known Allergies  Physical exam:  Today's Vitals   01/11/22 1052  BP: 129/84  Pulse: 78  Weight: 221 lb (100.2 kg)  Height: 5\' 8"  (1.727 m)   Body mass index is 33.6 kg/m.   Wt Readings from Last 3 Encounters:  01/11/22 221 lb (100.2 kg)  12/23/21 220 lb (99.8 kg)  02/06/20 208 lb (94.3 kg)     Ht Readings from Last 3 Encounters:  01/11/22 5\' 8"  (1.727 m)  12/23/21 5\' 8"  (1.727 m)  02/06/20 5\' 8"  (1.727 m)      General: The patient is awake, alert and appears not in acute distress. The patient is well groomed. Head: Normocephalic, atraumatic. Neck is supple.  Mallampati 2- large tonsils. ,  neck circumference:17  inches . Nasal airflow is patent.  Retrognathia is not seen. Full facial hair.  Dental status: intact  Cardiovascular:  Regular rate and cardiac rhythm by pulse,  without distended neck veins. Respiratory: Lungs are clear to auscultation.  Skin:  Without evidence of ankle edema, or rash. Trunk: The patient's posture is erect.  Neurologic exam : The patient is awake and alert, oriented to place and time.   Memory subjective described as intact.  Attention span & concentration ability appears normal.  Speech is fluent,  without  dysarthria, dysphonia or aphasia.  Mood and affect are appropriate.   Cranial nerves: no loss of smell or taste reported  Pupils are equal and briskly reactive to light. Funduscopic exam deferred. Peripheral vision test is unchanged. Reports  vision is declining at night.  Extraocular movements in vertical and horizontal planes were intact and without nystagmus. No Diplopia. Hearing was intact to soft voice and finger rubbing.    Facial sensation intact to fine touch.  Facial motor strength is symmetric and tongue and uvula move midline.  Neck ROM : rotation, tilt and flexion extension were normal for age and shoulder shrug was symmetrical.    Motor exam:  Symmetric bulk, tone and ROM.   Normal tone without cog wheeling, symmetric grip strength .   Sensory:  Fine touch, pinprick and vibration were tested  and  normal.  Proprioception tested in the upper extremities was normal.   Coordination: Rapid alternating movements in the fingers/hands were of normal speed.  The Finger-to-nose maneuver was intact without evidence of ataxia, dysmetria or tremor.   Gait and station: Patient could rise unassisted from a seated position, walked without assistive device.  Stance is of normal width/ base and the patient turned with 3 steps.  Toe and heel walk were deferred.  Deep tendon reflexes: in the  upper and lower extremities are symmetric and intact.  Babinski response  was deferred.       After spending a total time of  40  minutes , including face to face and additional time for physical and neurologic examination, review of laboratory studies,  personal review of imaging studies, reports and results of other testing and review of referral information / records as far as provided in visit, I have established the following assessments:  1)  abnormal EEG, left asymmetric pattern. 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 Mitochondrial disorder? He also has hearing loss.  3)  fatigue and snoring, possible OSA. Waking up sore. Stiffness, hip pain, shoulder pain.    My Plan is to proceed with:  1)  PSG, wife reports kicking , being restless- this can be due to discomfort, or PLMs disorder. Attended PSG .  2) no microcytic anemia found. I wondered if we should check for thalassemia.   2 b)Consider mitochondrial disorders, in regards to vision and hearing and seizure disorder.  Attended  PSG with normal EEG set up.  Also paroxysmal atrial fib requires a channel for EKG, not provided by a HST. Marland Kitchen    3) LFTs, add vit D. Just OTC supplement.   I would like to thank Marylen Ponto, MD and Ocie Doyne, Md [address] [place],  [place] [address] for allowing me to meet with and to take care of this pleasant patient.   In short, Jesus Higgins is presenting with achiness, soreness, non refreshing sleep and sleep snoring , a symptom that can be attributed to OSA. His study should be attended  for EEG  accompanying his  recording- seizure risk is higher due to abnormal left temporal discharges.  He has a history of paroxysmal Atrial fibrillation.    I plan to follow up either personally or through our NP within 2-4 months.   CC: I will  share my notes with PCP and Dr. Delena Bali.  Electronically signed by: Melvyn Novas, MD 01/11/2022 11:17 AM  [facility] and [facility] Board  certified by The [employer] of Sleep Medicine and Diplomate of the [employer] of Sleep Medicine. Board certified In Neurology through the ABPN, Fellow of the [employer] of Neurology. Medical Director of [facility].

## 2022-01-17 ENCOUNTER — Encounter: Payer: Self-pay | Admitting: Psychiatry

## 2022-01-18 ENCOUNTER — Telehealth: Payer: Self-pay

## 2022-01-18 ENCOUNTER — Other Ambulatory Visit: Payer: Self-pay | Admitting: Psychiatry

## 2022-01-18 DIAGNOSIS — R404 Transient alteration of awareness: Secondary | ICD-10-CM

## 2022-01-18 DIAGNOSIS — R0683 Snoring: Secondary | ICD-10-CM

## 2022-01-18 DIAGNOSIS — R413 Other amnesia: Secondary | ICD-10-CM

## 2022-01-18 NOTE — Telephone Encounter (Signed)
Dr. Vickey Huger has ordered a NPSG for this patient. Unfortunately insurance has denied this request. They have approved for a HST. I need an order for HST please.

## 2022-01-18 NOTE — Telephone Encounter (Signed)
HST order is placed for the pt

## 2022-01-18 NOTE — Addendum Note (Signed)
Addended by: Darleen Crocker on: 01/18/2022 03:59 PM   Modules accepted: Orders

## 2022-01-19 ENCOUNTER — Telehealth: Payer: Self-pay | Admitting: Psychiatry

## 2022-01-19 NOTE — Telephone Encounter (Signed)
MR Brain w/wo contrast Dr. Oda Kilts Berkley Harvey: [id number] (exp. 01/20/22 to 02/17/22)  Patient is scheduled at [facility] for 02/10/22.

## 2022-01-22 DIAGNOSIS — H33323 Round hole, bilateral: Secondary | ICD-10-CM | POA: Diagnosis not present

## 2022-02-08 ENCOUNTER — Ambulatory Visit (INDEPENDENT_AMBULATORY_CARE_PROVIDER_SITE_OTHER): Payer: BC Managed Care – PPO | Admitting: Neurology

## 2022-02-08 DIAGNOSIS — I480 Paroxysmal atrial fibrillation: Secondary | ICD-10-CM

## 2022-02-08 DIAGNOSIS — R0683 Snoring: Secondary | ICD-10-CM

## 2022-02-08 DIAGNOSIS — R9401 Abnormal electroencephalogram [EEG]: Secondary | ICD-10-CM

## 2022-02-08 DIAGNOSIS — R413 Other amnesia: Secondary | ICD-10-CM

## 2022-02-08 DIAGNOSIS — G4733 Obstructive sleep apnea (adult) (pediatric): Secondary | ICD-10-CM

## 2022-02-10 ENCOUNTER — Ambulatory Visit (INDEPENDENT_AMBULATORY_CARE_PROVIDER_SITE_OTHER): Payer: BC Managed Care – PPO

## 2022-02-10 DIAGNOSIS — R404 Transient alteration of awareness: Secondary | ICD-10-CM | POA: Diagnosis not present

## 2022-02-10 MED ORDER — GADOBENATE DIMEGLUMINE 529 MG/ML IV SOLN
20.0000 mL | Freq: Once | INTRAVENOUS | Status: AC | PRN
  Administered 2022-02-10: 20 mL via INTRAVENOUS

## 2022-02-10 NOTE — Progress Notes (Signed)
?  ?  ?[facility] ?  ?HOME SLEEP TEST REPORT ( by Watch PAT)   ?STUDY DATA:  02-10-2022 ?  ?ORDERING CLINICIAN: Carmen Dohmeier, MD  ?REFERRING CLINICIAN: Jennifer Chima, MD  ?  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 He was born prematurely and has a twin brother.  ?He is snoring, has daytime fatigue and memory loss. Ongoing snoring for years when sleeping on back. He has had sinus problems. He does not snore when not supine. Wife reports him to be asleep quickly, but he feels sore and achy . This wakes him up frequently -not able to sleep through the night while not having trouble to initiate sleep. ?Father has OSA and uses CPAP.   ?  ?Epworth sleepiness score: 8/24. ?  ?BMI: 33.4 kg/m? ?  ?Neck Circumference: 17"  ?  ?FINDINGS: ?  ?Sleep Summary: ?  ?Total Recording Time (hours, min): Total recording time amounted to 9 hours and 22 minutes of which 8 hours and 42 minutes were the calculated total sleep time    ?  ?Percent REM (%): 25.5%                                    ?  ?Respiratory Indices: ?  ?Calculated pAHI (per hour):   9.4/h                        ?  ?REM pAHI:    16.7/h                                           ?  ?NREM pAHI:   6.9/h                         ?  ?Positional AHI:    The patient spent most time in supine sleep position with an AHI of 9.5 versus sleep in right lateral sleep position , associated with an AHI of 4.8/h.   ? ?Snoring was loud.  The mean volume was 50 dB and snoring accompanied and 92.4% of total sleep time according to this home sleep test device.  390 minutes of sleep were associated with snoring over 50 dB.   ?This correlates with an RDI (also known as respiratory disturbance index) of 12/h in supine and a 14.5/h while sleeping on the right side.                                           ?  ?Oxygen Saturation Statistics: ?   ?O2 Saturation Range (%):   Oxygen saturation varied between a nadir of 91% and a maximum of 99% saturation with a mean saturation at 95%                                 ?O2 Saturation (minutes) <89%:   0 minutes      ?  ?Pulse Rate Statistics:     ?  ?Pulse Range:      Ranged between a minimum of 55 bpm and a maximum heart rate of 142 bpm with a mean heart rate of 86 bpm. ? ?The lowest and highest heart rate were noticed within REM sleep in right lateral sleep position. This could be a manifestation of atrial fibrillation.          ?  ?IMPRESSION:  This HST confirms the presence of mild, REM sleep dependent obstructive sleep apnea associated with loud and severe snoring.   ?The sleep test  captured tacky and bradycardia during REM sleep, but is not equipped to give information about the cardiac rhythm. ?  ?RECOMMENDATION: In spite of the overall mild sleep apnea there is recent to treat it more aggressively and immediately.  I would strongly recommend positive airway pressure therapy due to the REM dependent character of the sleep apnea and also because there is such a loud snoring recorded. ?I am also concerned about the peak and then drop in heart rate which can indicate seizure activity or paroxysmal atrial fibrillation.  For this reason I would asked the patient to come in for an attended CPAP titration.  This will allow us to fit him for the best mask, see the Pap effect on apnea, cardiac rhythm and snoring.  Limited EEG will also help us to differentiate any seizure activity. ? ?  ?INTERPRETING PHYSICIAN: ? ? Carmen Dohmeier, MD  ? ?Medical Director of [facility].  ? ? ? ? ? ? ? ? ? ? ? ? ? ? ? ? ? ? ? ?

## 2022-02-14 DIAGNOSIS — G4733 Obstructive sleep apnea (adult) (pediatric): Secondary | ICD-10-CM | POA: Insufficient documentation

## 2022-02-14 DIAGNOSIS — R9401 Abnormal electroencephalogram [EEG]: Secondary | ICD-10-CM

## 2022-02-14 DIAGNOSIS — R0683 Snoring: Secondary | ICD-10-CM

## 2022-02-14 DIAGNOSIS — R413 Other amnesia: Secondary | ICD-10-CM

## 2022-02-14 DIAGNOSIS — I480 Paroxysmal atrial fibrillation: Secondary | ICD-10-CM | POA: Insufficient documentation

## 2022-02-14 HISTORY — DX: Snoring: R06.83

## 2022-02-14 HISTORY — DX: Other amnesia: R41.3

## 2022-02-14 HISTORY — DX: Obstructive sleep apnea (adult) (pediatric): G47.33

## 2022-02-14 HISTORY — DX: Abnormal electroencephalogram (EEG): R94.01

## 2022-02-14 NOTE — Addendum Note (Signed)
Addended by: Melvyn Novas on: 02/14/2022 04:28 PM ? ? Modules accepted: Orders ? ?

## 2022-02-14 NOTE — Progress Notes (Signed)
Pulse Range: ???Ranged between a minimum of 55 bpm and a maximum heart rate of 142 bpm with a mean heart rate of 86 bpm. ?? ?The lowest and highest heart rate were noticed within REM sleep in right lateral sleep position.?This could be a manifestation of atrial fibrillation.????????? ?? ?IMPRESSION:  This HST confirms the presence of mild, REM sleep dependent obstructive sleep apnea associated with loud and severe snoring.   ?The sleep test  captured tacky and bradycardia during REM sleep, but is not equipped to give information about the cardiac rhythm. ?? ?RECOMMENDATION: In spite of the overall mild sleep apnea there is recent to treat it more aggressively and immediately.  I would strongly recommend positive airway pressure therapy due to the REM dependent character of the sleep apnea and also because there is such a loud snoring recorded. ?I am also concerned about the peak and then drop in heart rate which can indicate seizure activity or paroxysmal atrial fibrillation.  For this reason I would asked the patient to come in for an attended CPAP titration.  This will allow [place] to fit him for the best mask, see the Pap effect on apnea, cardiac rhythm and snoring.  Limited EEG will also help [place] to differentiate any seizure activity. ??

## 2022-02-14 NOTE — Procedures (Signed)
?  ?  ?[facility] ?  ?HOME SLEEP TEST REPORT ( by Watch PAT)   ?STUDY DATA:  02-10-2022 ?  ?ORDERING CLINICIAN: Melvyn Novas, MD  ?REFERRING CLINICIAN: Ocie Doyne, MD  ?  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 He was born prematurely and has a twin brother.  ?He is snoring, has daytime fatigue and memory loss. Ongoing snoring for years when sleeping on back. He has had sinus problems. He does not snore when not supine. Wife reports him to be asleep quickly, but he feels sore and achy . This wakes him up frequently -not able to sleep through the night while not having trouble to initiate sleep. ?Father has OSA and uses CPAP.   ?  ?Epworth sleepiness score: 8/24. ?  ?BMI: 33.4 kg/m? ?  ?Neck Circumference: 17"  ?  ?FINDINGS: ?  ?Sleep Summary: ?  ?Total Recording Time (hours, min): Total recording time amounted to 9 hours and 22 minutes of which 8 hours and 42 minutes were the calculated total sleep time    ?  ?Percent REM (%): 25.5%                                    ?  ?Respiratory Indices: ?  ?Calculated pAHI (per hour):   9.4/h                        ?  ?REM pAHI:    16.7/h                                           ?  ?NREM pAHI:   6.9/h                         ?  ?Positional AHI:    The patient spent most time in supine sleep position with an AHI of 9.5 versus sleep in right lateral sleep position , associated with an AHI of 4.8/h.   ? ?Snoring was loud.  The mean volume was 50 dB and snoring accompanied and 92.4% of total sleep time according to this home sleep test device.  390 minutes of sleep were associated with snoring over 50 dB.   ?This correlates with an RDI (also known as respiratory disturbance index) of 12/h in supine and a 14.5/h while sleeping on the right side.                                           ?  ?Oxygen Saturation Statistics: ?   ?O2 Saturation Range (%):   Oxygen saturation varied between a nadir of 91% and a maximum of 99% saturation with a mean saturation at 95%                                 ?O2 Saturation (minutes) <89%:   0 minutes      ?  ?Pulse Rate Statistics:     ?  ?Pulse Range:  Ranged between a minimum of 55 bpm and a maximum heart rate of 142 bpm with a mean heart rate of 86 bpm. ? ?The lowest and highest heart rate were noticed within REM sleep in right lateral sleep position. This could be a manifestation of atrial fibrillation.          ?  ?IMPRESSION:  This HST confirms the presence of mild, REM sleep dependent obstructive sleep apnea associated with loud and severe snoring.   ?The sleep test  captured tacky and bradycardia during REM sleep, but is not equipped to give information about the cardiac rhythm. ?  ?RECOMMENDATION: In spite of the overall mild sleep apnea there is recent to treat it more aggressively and immediately.  I would strongly recommend positive airway pressure therapy due to the REM dependent character of the sleep apnea and also because there is such a loud snoring recorded. ?I am also concerned about the peak and then drop in heart rate which can indicate seizure activity or paroxysmal atrial fibrillation.  For this reason I would asked the patient to come in for an attended CPAP titration.  This will allow [place] to fit him for the best mask, see the Pap effect on apnea, cardiac rhythm and snoring.  Limited EEG will also help [place] to differentiate any seizure activity. ? ?  ?INTERPRETING PHYSICIAN: ? ? Melvyn Novas, MD  ? ?Medical Director of [facility].  ? ? ? ? ? ? ? ? ? ? ? ? ? ? ? ? ? ? ? ?

## 2022-02-15 ENCOUNTER — Telehealth: Payer: Self-pay

## 2022-02-15 ENCOUNTER — Other Ambulatory Visit: Payer: Self-pay | Admitting: Psychiatry

## 2022-02-15 ENCOUNTER — Encounter: Payer: Self-pay | Admitting: Psychiatry

## 2022-02-15 ENCOUNTER — Telehealth: Payer: Self-pay | Admitting: Neurology

## 2022-02-15 DIAGNOSIS — R0683 Snoring: Secondary | ICD-10-CM

## 2022-02-15 DIAGNOSIS — G478 Other sleep disorders: Secondary | ICD-10-CM

## 2022-02-15 MED ORDER — LAMOTRIGINE 25 MG PO TABS: ORAL_TABLET | ORAL | 0 refills | Status: DC

## 2022-02-15 NOTE — Telephone Encounter (Signed)

## 2022-02-15 NOTE — Telephone Encounter (Signed)
Insurance has denied CPAP titration sleep study. Pt has to try autoPAP first.  ?

## 2022-02-15 NOTE — Telephone Encounter (Signed)
-----   Message from Melvyn Novas, MD sent at 02/14/2022  4:27 PM EDT ----- ?Pulse Range: ???Ranged between a minimum of 55 bpm and a maximum heart rate of 142 bpm with a mean heart rate of 86 bpm. ?? ?The lowest and highest heart rate were noticed within REM sleep in right lateral sleep position.?This could be a manifestation of atrial fibrillation.????????? ?? ?IMPRESSION:  This HST confirms the presence of mild, REM sleep dependent obstructive sleep apnea associated with loud and severe snoring.   ?The sleep test  captured tacky and bradycardia during REM sleep, but is not equipped to give information about the cardiac rhythm. ?? ?RECOMMENDATION: In spite of the overall mild sleep apnea there is recent to treat it more aggressively and immediately.  I would strongly recommend positive airway pressure therapy due to the REM dependent character of the sleep apnea and also because there is such a loud snoring recorded. ?I am also concerned about the peak and then drop in heart rate which can indicate seizure activity or paroxysmal atrial fibrillation.  For this reason I would asked the patient to come in for an attended CPAP titration.  This will allow [place] to fit him for the best mask, see the Pap effect on apnea, cardiac rhythm and snoring.  Limited EEG will also help [place] to differentiate any seizure activity. ?? ?

## 2022-02-16 NOTE — Telephone Encounter (Signed)
Addressed this information and next steps in previous sleep result phone note ?

## 2022-02-16 NOTE — Telephone Encounter (Signed)
"  Insurance has denied CPAP titration sleep study. Pt has to try autoPAP first." ? ?I called pt. I advised pt that Dr. Vickey Huger reviewed their sleep study results and found that pt has osa. Since insurance will not cover the titration study, Dr. Vickey Huger recommends that pt start auto CPAP. I reviewed PAP compliance expectations with the pt. Pt is agreeable to starting a CPAP. I advised pt that an order will be sent to a DME, [facility], and [facility] will call the pt within about one week after they file with the pt's insurance. [facility] will show the pt how to use the machine, fit for masks, and troubleshoot the CPAP if needed. A follow up appt was made for insurance purposes with Shawnie Dapper, NP on May 29,2023 at 8 am. Pt verbalized understanding to arrive 15 minutes early and bring their CPAP. A letter with all of this information in it will be mailed to the pt as a reminder. I verified with the pt that the address we have on file is correct. Pt verbalized understanding of results. Pt had no questions at this time but was encouraged to call back if questions arise. I have sent the order to [facility] and have received confirmation that they have received the order. ? ? ?

## 2022-02-16 NOTE — Addendum Note (Signed)
Addended by: Judi Cong on: 02/16/2022 08:31 AM ? ? Modules accepted: Orders ? ?

## 2022-02-19 ENCOUNTER — Ambulatory Visit (INDEPENDENT_AMBULATORY_CARE_PROVIDER_SITE_OTHER): Payer: BC Managed Care – PPO | Admitting: Licensed Clinical Social Worker

## 2022-02-19 ENCOUNTER — Other Ambulatory Visit: Payer: Self-pay

## 2022-02-19 ENCOUNTER — Encounter (HOSPITAL_COMMUNITY): Payer: Self-pay

## 2022-02-19 DIAGNOSIS — F410 Panic disorder [episodic paroxysmal anxiety] without agoraphobia: Secondary | ICD-10-CM

## 2022-02-19 NOTE — Plan of Care (Signed)
?  Problem: Anxiety Disorder CCP Problem  1 Managing panic attacks  ?Goal:  Alinda Money will manage anxiety as evidenced by managing physical symptoms of panic attacks, identifying internal and external cues, and challenging anxious thoughts for 5 out of 7 days for 60 days.  ?Outcome: Not Progressing ?Goal: STG: Patient will practice problem solving skills 3 times per week for the next 4 weeks ?Outcome: Not Progressing ?  ?
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
## 2022-03-11 DIAGNOSIS — G4733 Obstructive sleep apnea (adult) (pediatric): Secondary | ICD-10-CM | POA: Diagnosis not present

## 2022-03-12 ENCOUNTER — Ambulatory Visit (HOSPITAL_COMMUNITY): Payer: PRIVATE HEALTH INSURANCE | Admitting: Licensed Clinical Social Worker

## 2022-03-17 DIAGNOSIS — H33323 Round hole, bilateral: Secondary | ICD-10-CM | POA: Diagnosis not present

## 2022-03-26 ENCOUNTER — Ambulatory Visit (INDEPENDENT_AMBULATORY_CARE_PROVIDER_SITE_OTHER): Payer: BC Managed Care – PPO | Admitting: Licensed Clinical Social Worker

## 2022-03-26 DIAGNOSIS — F410 Panic disorder [episodic paroxysmal anxiety] without agoraphobia: Secondary | ICD-10-CM

## 2022-03-27 NOTE — Progress Notes (Signed)
? ?  THERAPIST PROGRESS NOTE ? ?Session Time: 9:00 am-9:45 am ? ?Type of Therapy: Individual Therapy ? ?Purpose of session: Alinda Money will manage anxiety as evidenced by managing physical symptoms of panic attacks, identifying internal and external cues, and challenging anxious thoughts for 5 out of 7 days for 60 days.  ? ?ProgressTowards Goals: Initial ? ?Interventions: Therapist utilized CBT and Solution focused brief therapy to address anxiety. Therapist provided support and empathy to patient during session. Therapist provided psychoeducation on CBT. Therapist processed patient's feelings to identify thoughts that would increase anxiety and mood.  ? ?Effectiveness: Patient was oriented x4 (person, place, situation, and time). Patient was alert, engaged, pleasant, and cooperative. Patient was casually dressed, and appropriately groomed. Patient has not been sleeping well. He has not been exercising like he normally does but is trying to get back to doing do. Patient understood the concepts of CBT. Patient shared that his wife cheated on him a few years ago. He is hypersensitive to some of her behaviors such as deleting texts, or always being on her phone. Patient noted that his wife pulling away reminds him of the last time she cheated on him and he feels anxious about this. He loves his wife and wants to keep his marriage. Patient is anxious of what would occur if she left with their relationship, the children, and financially.  ? ?Patient engaged in session. He responded well to interventions. Patient continues to meet criteria for panic disorder. Patient will continue in outpatient therapy due to being the least restrictive service to meet his needs. Patient made no progress on his goals at this times.  ? ?Suicidal/Homicidal: Nowithout intent/plan ? ?Plan: Return again in 1-2 weeks. Patient will pay attention to his thoughts and feelings.  ? ?Diagnosis: Panic disorder ? ?Collaboration of Care: Other sources will be  identified to collaborate with for patient.  ? ?Patient/Guardian was advised Release of Information must be obtained prior to any record release in order to collaborate their care with an outside provider. Patient/Guardian was advised if they have not already done so to contact the registration department to sign all necessary forms in order for [place] to release information regarding their care.  ? ?Consent: Patient/Guardian gives verbal consent for treatment and assignment of benefits for services provided during this visit. Patient/Guardian expressed understanding and agreed to proceed.  ? ?Bynum Bellows, LCSW ?03/27/2022 ? ?

## 2022-04-09 ENCOUNTER — Ambulatory Visit (HOSPITAL_COMMUNITY): Payer: PRIVATE HEALTH INSURANCE | Admitting: Licensed Clinical Social Worker

## 2022-04-10 DIAGNOSIS — G4733 Obstructive sleep apnea (adult) (pediatric): Secondary | ICD-10-CM | POA: Diagnosis not present

## 2022-04-22 ENCOUNTER — Ambulatory Visit (HOSPITAL_COMMUNITY): Payer: PRIVATE HEALTH INSURANCE | Admitting: Licensed Clinical Social Worker

## 2022-04-27 ENCOUNTER — Telehealth (INDEPENDENT_AMBULATORY_CARE_PROVIDER_SITE_OTHER): Payer: BC Managed Care – PPO | Admitting: Family Medicine

## 2022-04-27 ENCOUNTER — Encounter: Payer: Self-pay | Admitting: Family Medicine

## 2022-04-27 DIAGNOSIS — R413 Other amnesia: Secondary | ICD-10-CM | POA: Diagnosis not present

## 2022-04-27 DIAGNOSIS — R404 Transient alteration of awareness: Secondary | ICD-10-CM

## 2022-04-27 DIAGNOSIS — G4733 Obstructive sleep apnea (adult) (pediatric): Secondary | ICD-10-CM | POA: Diagnosis not present

## 2022-04-27 DIAGNOSIS — G44229 Chronic tension-type headache, not intractable: Secondary | ICD-10-CM | POA: Diagnosis not present

## 2022-04-27 DIAGNOSIS — Z9989 Dependence on other enabling machines and devices: Secondary | ICD-10-CM

## 2022-04-27 NOTE — Progress Notes (Signed)
New order faxed to DME: [facility] Fax:[phone]   Fax confirmation received 

## 2022-04-27 NOTE — Patient Instructions (Addendum)
Please continue using your CPAP regularly. While your insurance requires that you use CPAP at least 4 hours each night on 70% of the nights, I recommend, that you not skip any nights and use it throughout the night if you can. Getting used to CPAP and staying with the treatment long term does take time and patience and discipline. Untreated obstructive sleep apnea when it is moderate to severe can have an adverse impact on cardiovascular health and raise her risk for heart disease, arrhythmias, hypertension, congestive heart failure, stroke and diabetes. Untreated obstructive sleep apnea causes sleep disruption, nonrestorative sleep, and sleep deprivation. This can have an impact on your day to day functioning and cause daytime sleepiness and impairment of cognitive function, memory loss, mood disturbance, and problems focussing. Using CPAP regularly can improve these symptoms.   Continue lamotrigine as directed by Dr Delena Bali. You will follow up with her in July. Here is the titration schedule she wanted you to complete, You have completed Week 6 and 7 so start at week 8 as follows:                                 Morning               Evening Week 8 3   (50mg ) 3   (75mg )  Week 9 3   (75mg ) 4   (75mg )  Week 10 4   (75mg ) 4   (100mg )         Week 11 4   (75mg ) 5   (100mg )  Week 12 5   (100mg ) 5   (100mg )    Follow up with me for CPAP compliance pending review in 30 days.

## 2022-04-27 NOTE — Progress Notes (Signed)
PATIENT: Jesus Higgins DOB: 1981-02-26  REASON FOR VISIT: follow up HISTORY FROM: patient  Virtual Visit via Telephone Note  I connected with Lavone Nian on 04/27/22 at  8:00 AM EDT by telephone and verified that I am speaking with the correct person using two identifiers.   I discussed the limitations, risks, security and privacy concerns of performing an evaluation and management service by telephone and the availability of in person appointments. I also discussed with the patient that there may be a patient responsible charge related to this service. The patient expressed understanding and agreed to proceed.   History of Present Illness:  04/27/22 ALL: Jesus Higgins is a 41 y.o. male here today for follow up for recently diagnosed OSA started on CPAP. He was seen in consult with Dr Billey Gosling 12/23/2021 for memory loss, imbalance and headaches. 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 He was started on levetiracetam 500mg  BID. He was unable to tolerate levetiracetam due to irritability and anger outburst. He was started on lamotrigine. He was advised to titrate dose. He reports not being able to remember titration schedule but has continued 50mg  BID for three weeks. He feels that transient awareness episodes have decreased significantly. He has had a few events that he feels were more stress related. Referral to sleep placed and he was seen by Dr Brett Fairy 01/11/2022 for transient alterations of consciousness. HST showed overall mild OSA with total AHI 9.4/hr, REM AHI 16.7/hr and O2 nadir of 92%. He was started on autoPAP.   Since, he does not feel that he is tolerating CPAP. He has tried multiple masks and can not find a good fit. He sleeps on his side, normally, but unable to since using CPAP. He reports trying several full face masks and nasal pillow. He does not feel he can get comfortable. He has a large air leak. He has not noted any particular benefit  form using CPAP. He does mention that headaches are significantly better but feels that headaches improved after starting lamotrigine.      History (copied from previous note)   Observations/Objective:  Generalized: Well developed, in no acute distress  Mentation: Alert oriented to time, place, history taking. Follows all commands speech and language fluent   Assessment and Plan:  41 y.o. year old male  has a past medical history of Abnormal MRI, Allergy, Confusion, and Paroxysmal atrial fibrillation ([place]). here with    ICD-10-CM   1. OSA on CPAP  G47.33 For home use only DME continuous positive airway pressure (CPAP)   Z99.89     2. Transient alteration of awareness  R40.4     3. Memory loss  R41.3     4. Chronic tension-type headache, not intractable  G44.229      Jesus Higgins reports that headaches and transient altered awareness events are significantly improved. He has had a couple of events where he felt a little confused over the past few weeks but feels they are more correlated with stress. He is tolerating lamotrigine 50mg  BID. I will have him increase lamotrigine based on titration schedule provided in AVS. We have reviewed concerns with CPAP. Compliance report shows sub optimal usage. I have encouraged him to reach out to DME regarding mask refitting. We have reviewed gold standard in management of OSA versus alternative options. He agrees to use CPAP for 30 days. I will repeat compliance report at that time. If doing better, he will continue CPAP. Otherwise, he may consider dental appliance  versus revisit with Dr Brett Fairy to discuss options of Inspire. Total AHI 9/hr with REM AHI 16/hr. Healthy lifestyle habits encouraged. Seizure precaution education provided in AVS. He will follow up with Dr Billey Gosling as scheduled in 05/2022. He will return to see me pending next compliance review. He verbalizes understanding and agreement with this plan.    Orders Placed This Encounter  Procedures    For home use only DME continuous positive airway pressure (CPAP)    Mask refitting due to elevated leak.    Order Specific Question:   Length of Need    Answer:   Lifetime    Order Specific Question:   Patient has OSA or probable OSA    Answer:   Yes    Order Specific Question:   Is the patient currently using CPAP in the home    Answer:   Yes    Order Specific Question:   Settings    Answer:   Other see comments    Order Specific Question:   CPAP supplies needed    Answer:   Mask, headgear, cushions, filters, heated tubing and water chamber    No orders of the defined types were placed in this encounter.    Follow Up Instructions:  I discussed the assessment and treatment plan with the patient. The patient was provided an opportunity to ask questions and all were answered. The patient agreed with the plan and demonstrated an understanding of the instructions.   The patient was advised to call back or seek an in-person evaluation if the symptoms worsen or if the condition fails to improve as anticipated.  I provided 30 minutes of non-face-to-face time during this encounter. Patient located at their place of residence during [place] visit. Provider is in the office.    Debbora Presto, NP

## 2022-05-12 ENCOUNTER — Encounter: Payer: Self-pay | Admitting: Family Medicine

## 2022-05-13 ENCOUNTER — Other Ambulatory Visit: Payer: Self-pay | Admitting: Neurology

## 2022-05-13 MED ORDER — LAMOTRIGINE 100 MG PO TABS: 100.0000 mg | ORAL_TABLET | Freq: Two times a day (BID) | ORAL | 5 refills | Status: DC

## 2022-06-16 ENCOUNTER — Telehealth: Payer: Self-pay | Admitting: Psychiatry

## 2022-06-16 NOTE — Telephone Encounter (Signed)
Pt requested to change 7/26 appointment with Dr. Delena Bali to a video [place]. Appointment has been changed and pt notified through [employer]

## 2022-06-17 ENCOUNTER — Telehealth: Payer: Self-pay | Admitting: Neurology

## 2022-06-17 NOTE — Telephone Encounter (Signed)
Received a notification that the patient turned his machine back into [facility] on 05/19/2022. He complained of having rawness under his nose and really difficult with using it. He had completed a mask refit but due to still having issues the cpap was turned back in.

## 2022-06-23 ENCOUNTER — Telehealth: Payer: BC Managed Care – PPO | Admitting: Psychiatry

## 2022-06-23 NOTE — Progress Notes (Deleted)
   CC:  altered mental status  Follow-up Visit  Last visit: 04/17/22  Brief HPI: 41 year old male with a history of anxiety, afib not on AC, hx of bilateral optic nerve atrophy who follows in clinic for episodes of altered mental status. Brain MRI showed a stable posterior deformity of the globes with normal brain parenchyma. EEG showed left temporal sharp and slow waves. PSG showed mild OSA and he was started on CPAP.  Interval History: ***  Prior therapies: Keppra - irritability Lamictal  Physical Exam:   Vital Signs: There were no vitals taken for this visit. GENERAL:  well appearing, in no acute distress, alert  SKIN:  Color, texture, turgor normal. No rashes or lesions HEAD:  Normocephalic/atraumatic. RESP: normal respiratory effort MSK:  No gross joint deformities.   NEUROLOGICAL: Mental Status: Alert, oriented to person, place and time, Follows commands, and Speech fluent and appropriate. Cranial Nerves: PERRL, face symmetric, no dysarthria, hearing grossly intact Motor: moves all extremities equally Gait: normal-based.  IMPRESSION: ***  PLAN: ***   Follow-up: ***  I spent a total of *** minutes on the date of the service. Discussed medication side effects, adverse reactions and drug interactions. Written educational materials and patient instructions outlining all of the above were given.  Ocie Doyne, MD

## 2022-07-27 ENCOUNTER — Telehealth: Payer: BC Managed Care – PPO | Admitting: Psychiatry

## 2022-07-27 NOTE — Progress Notes (Deleted)
   CC:  memory loss  Follow-up Visit  Last visit: 12/23/21  Brief HPI: 41 year old male with a history of anxiety, afib not on AC, hx of bilateral optic nerve atrophy who follows in clinic for memory loss.  At his last visit, EEG was ordered and he was referred to Sleep for OSA evaluation.  Interval History: *** EEG 12/28/21 showed left temporal sharp waves and focal slowing. He was started on Keppra but was unable to tolerate this due to irritability. He was switched to Lamictal***  PSG showed mild OSA and he was started on CPAP.  Physical Exam:   Vital Signs: There were no vitals taken for this visit. GENERAL:  well appearing, in no acute distress, alert  SKIN:  Color, texture, turgor normal. No rashes or lesions HEAD:  Normocephalic/atraumatic. RESP: normal respiratory effort MSK:  No gross joint deformities.   NEUROLOGICAL: Mental Status: Alert, oriented to person, place and time, Follows commands, and Speech fluent and appropriate. Cranial Nerves: PERRL, face symmetric, no dysarthria, hearing grossly intact Motor: moves all extremities equally Gait: normal-based.  IMPRESSION: ***  PLAN: ***   Follow-up: ***  I spent a total of *** minutes on the date of the service. Discussed medication side effects, adverse reactions and drug interactions. Written educational materials and patient instructions outlining all of the above were given.  Ocie Doyne, MD

## 2022-08-09 DIAGNOSIS — I480 Paroxysmal atrial fibrillation: Secondary | ICD-10-CM | POA: Diagnosis not present

## 2022-08-09 DIAGNOSIS — Z6832 Body mass index (BMI) 32.0-32.9, adult: Secondary | ICD-10-CM | POA: Diagnosis not present

## 2022-08-12 ENCOUNTER — Other Ambulatory Visit: Payer: Self-pay

## 2022-08-12 DIAGNOSIS — T7840XA Allergy, unspecified, initial encounter: Secondary | ICD-10-CM | POA: Insufficient documentation

## 2022-08-12 DIAGNOSIS — R9389 Abnormal findings on diagnostic imaging of other specified body structures: Secondary | ICD-10-CM | POA: Insufficient documentation

## 2022-08-12 DIAGNOSIS — G4733 Obstructive sleep apnea (adult) (pediatric): Secondary | ICD-10-CM | POA: Insufficient documentation

## 2022-08-12 DIAGNOSIS — R410 Disorientation, unspecified: Secondary | ICD-10-CM | POA: Insufficient documentation

## 2022-08-12 DIAGNOSIS — Z6832 Body mass index (BMI) 32.0-32.9, adult: Secondary | ICD-10-CM | POA: Insufficient documentation

## 2022-08-13 ENCOUNTER — Encounter: Payer: Self-pay | Admitting: Cardiology

## 2022-08-13 ENCOUNTER — Ambulatory Visit: Payer: PRIVATE HEALTH INSURANCE | Attending: Cardiology

## 2022-08-13 ENCOUNTER — Ambulatory Visit: Payer: PRIVATE HEALTH INSURANCE | Attending: Cardiology | Admitting: Cardiology

## 2022-08-13 VITALS — BP 128/74 | HR 78 | Ht 68.0 in | Wt 215.2 lb

## 2022-08-13 DIAGNOSIS — E669 Obesity, unspecified: Secondary | ICD-10-CM | POA: Insufficient documentation

## 2022-08-13 DIAGNOSIS — I480 Paroxysmal atrial fibrillation: Secondary | ICD-10-CM | POA: Diagnosis not present

## 2022-08-13 DIAGNOSIS — G4733 Obstructive sleep apnea (adult) (pediatric): Secondary | ICD-10-CM

## 2022-08-13 DIAGNOSIS — E66811 Obesity, class 1: Secondary | ICD-10-CM | POA: Insufficient documentation

## 2022-08-13 NOTE — Progress Notes (Signed)
Cardiology Office Note:    Date:  08/13/2022   ID:  Jesus Higgins, DOB [birth year], MRN [id number]  PCP:  Marylen Ponto, MD  Cardiologist:  Garwin Brothers, MD   Referring MD: Marylen Ponto, MD    ASSESSMENT:    1. Paroxysmal atrial fibrillation (HCC)   2. OSA (obstructive sleep apnea)   3. Obesity (BMI 30.0-34.9)    PLAN:    In order of problems listed above:  Primary prevention stressed with the patient.  Importance of compliance with diet and medication stressed and vocalized understanding.  He was advised to at least walk half an hour a day 5 days a week and he promises to do so.  CT scan done in the year or 2 ago reveals no evidence of coronary artery disease. Paroxysmal atrial fibrillation: We will do a 2-week monitor to assess this.  We will do a TSH.  See details about sleep apnea issues mentioned below.  His CHA2DS2-VASc score is 0. Sleep apnea: He is not compliant with using his equipment.  I cautioned him about this at extensive length.  He tells me that he cannot be comfortable using it.  I told him to get an appointment with his sleep specialist to discuss this issues and compliance was urged.  He promises to do better. We will send to: Weight reduction was stressed and diet was emphasized and patient promises to do better. Patient will be seen in follow-up appointment in 9 months or earlier if the patient has any concerns    Medication Adjustments/Labs and Tests Ordered: Current medicines are reviewed at length with the patient today.  Concerns regarding medicines are outlined above.  No orders of the defined types were placed in this encounter.  No orders of the defined types were placed in this encounter.    History of Present Illness:    Jesus Higgins is a 41 y.o. male who is being seen today for the evaluation of paroxysmal atrial fibrillation at the request of Marylen Ponto, MD. patient has past medical history of obstructive sleep apnea and paroxysmal  atrial fibrillation.  He mentions to me that his wife is a Engineer, civil (consulting) by profession.  He mentions to me that she feels he has paroxysms of atrial fibrillation.  It is not clear however she diagnosed this.  Patient has had paroxysmal atrial fibrillation in the past.  He has sleep apnea but he does not use the machine on a consistent basis.  At the time of my evaluation, the patient is alert awake oriented and in no distress.  Patient does see a sleep specialist.  He does not exercise on a regular basis.  With activities of daily living he has no chest pain orthopnea or PND.  At the time of my evaluation, the patient is alert awake oriented and in no distress.  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   ibuprofen (ADVIL) 800 MG tablet Take 800 mg by mouth 3 (three) times daily as needed for pain or headache.   lamoTRIgine (LAMICTAL) 100 MG tablet Take 1 tablet (100 mg total) by mouth 2 (two) times daily.     Allergies:   Patient has no known allergies.   Social History   Socioeconomic History   Marital status: Married    Spouse name: Melanie   Number of children: 2   Years of education: Not on file   Highest education level: Not on file  Occupational History   Not on file  Tobacco Use   Smoking status: Never   Smokeless tobacco: Never  Vaping Use   Vaping Use: Never used  Substance and Sexual Activity   Alcohol  use: Yes    Comment: Rare, special occasion   Drug use: Never   Sexual activity: Not on file  Other Topics Concern   Not on file  Social History Narrative   Lives with family   Right handed   Caffeine: 2 cup of coffee in a day, energy drink 1-2x a week.    Social Determinants of Health   Financial Resource Strain: Not on file  Food Insecurity: Not on file  Transportation Needs: Not on file  Physical Activity: Not on file  Stress: Not on file  Social Connections: Not on file     Family History: The patient's family history includes Cancer in his maternal aunt and mother; Early death in his mother; Heart disease in his maternal aunt; Hyperlipidemia in his father; Hypertension in his father and maternal grandmother.  ROS:   Please see the history of present illness.    All other systems reviewed and are negative.  EKGs/Labs/Other Studies Reviewed:    The following studies were reviewed today: EKG reveals sinus rhythm and nonspecific ST-T changes   Recent Labs: 11/28/2021: BUN 19; Creatinine, Ser 0.93; Hemoglobin 13.7; Platelets 285; Potassium 3.8; Sodium 136 12/23/2021: TSH 2.670  Recent Lipid Panel No results found for: "CHOL", "TRIG", "HDL", "CHOLHDL", "VLDL", "LDLCALC", "LDLDIRECT"  Physical Exam:    VS:  BP 128/74   Pulse 78   Ht 5\' 8"  (1.727 m)   Wt 215 lb 3.2 oz (97.6 kg)   SpO2 98%   BMI 32.72 kg/m     Wt Readings from Last 3 Encounters:  08/13/22 215 lb 3.2 oz (97.6 kg)  01/11/22 221 lb (100.2 kg)  12/23/21 220 lb (99.8 kg)     GEN: Patient is in no acute distress HEENT: Normal NECK: No JVD; No carotid bruits LYMPHATICS: No lymphadenopathy CARDIAC: S1 S2 regular, 2/6 systolic murmur at the apex. RESPIRATORY:  Clear to auscultation without rales, wheezing or rhonchi  ABDOMEN: Soft, non-tender, non-distended MUSCULOSKELETAL:  No edema; No deformity  SKIN: Warm and dry NEUROLOGIC:  Alert and oriented x 3 PSYCHIATRIC:  Normal affect     Signed, 12/25/21, MD  08/13/2022 9:19 AM    [facility]

## 2022-08-13 NOTE — Patient Instructions (Signed)
Medication Instructions:  Your physician recommends that you continue on your current medications as directed. Please refer to the Current Medication list given to you today.  *If you need a refill on your cardiac medications before your next appointment, please call your pharmacy*   Lab Work: Your physician recommends that you have a TSH today in the office.  If you have labs (blood work) drawn today and your tests are completely normal, you will receive your results only by: MyChart Message (if you have MyChart) OR A paper copy in the mail If you have any lab test that is abnormal or we need to change your treatment, we will call you to review the results.   Testing/Procedures: Your physician has requested that you have an echocardiogram. Echocardiography is a painless test that uses sound waves to create images of your heart. It provides your doctor with information about the size and shape of your heart and how well your heart's chambers and valves are working. This procedure takes approximately one hour. There are no restrictions for this procedure.   WHY IS MY DOCTOR PRESCRIBING ZIO? The Zio system is proven and trusted by physicians to detect and diagnose irregular heart rhythms -- and has been prescribed to hundreds of thousands of patients.  The FDA has cleared the Zio system to monitor for many different kinds of irregular heart rhythms. In a study, physicians were able to reach a diagnosis 90% of the time with the Zio system1.  You can wear the Zio monitor -- a small, discreet, comfortable patch -- during your normal day-to-day activity, including while you sleep, shower, and exercise, while it records every single heartbeat for analysis.  1Barrett, P., et al. Comparison of 24 Hour Holter Monitoring Versus 14 Day Novel Adhesive Patch Electrocardiographic Monitoring. American Journal of Medicine, 2014.  ZIO VS. HOLTER MONITORING The Zio monitor can be comfortably worn for up to  14 days. Holter monitors can be worn for 24 to 48 hours, limiting the time to record any irregular heart rhythms you may have. Zio is able to capture data for the 51% of patients who have their first symptom-triggered arrhythmia after 48 hours.1  LIVE WITHOUT RESTRICTIONS The Zio ambulatory cardiac monitor is a small, unobtrusive, and water-resistant patch--you might even forget you're wearing it. The Zio monitor records and stores every beat of your heart, whether you're sleeping, working out, or showering. Wear the monitor for 14 days, remove 08/27/22.    Follow-Up: At [facility] [place], you and your health needs are our priority.  As part of our continuing mission to provide you with exceptional heart care, we have created designated Provider Care Teams.  These Care Teams include your primary Cardiologist (physician) and Advanced Practice Providers (APPs -  Physician Assistants and Nurse Practitioners) who all work together to provide you with the care you need, when you need it.  We recommend signing up for the patient portal called "MyChart".  Sign up information is provided on this After Visit Summary.  MyChart is used to connect with patients for Virtual Visits (Telemedicine).  Patients are able to view lab/test results, encounter notes, upcoming appointments, etc.  Non-urgent messages can be sent to your provider as well.   To learn more about what you can do with MyChart, go to [email or web address].    Your next appointment:   9 month(s)  The format for your next appointment:   In Person  Provider:   Belva Crome, MD   Other Instructions Echocardiogram An  echocardiogram is a test that uses sound waves (ultrasound) to produce images of the heart. Images from an echocardiogram can provide important information about: Heart size and shape. The size and thickness and movement of your heart's walls. Heart muscle function and strength. Heart valve function or if you have stenosis.  Stenosis is when the heart valves are too narrow. If blood is flowing backward through the heart valves (regurgitation). A tumor or infectious growth around the heart valves. Areas of heart muscle that are not working well because of poor blood flow or injury from a heart attack. Aneurysm detection. An aneurysm is a weak or damaged part of an artery wall. The wall bulges out from the normal force of blood pumping through the body. Tell a health care provider about: Any allergies you have. All medicines you are taking, including vitamins, herbs, eye drops, creams, and over-the-counter medicines. Any blood disorders you have. Any surgeries you have had. Any medical conditions you have. Whether you are pregnant or may be pregnant. What are the risks? Generally, this is a safe test. However, problems may occur, including an allergic reaction to dye (contrast) that may be used during the test. What happens before the test? No specific preparation is needed. You may eat and drink normally. What happens during the test? You will take off your clothes from the waist up and put on a hospital gown. Electrodes or electrocardiogram (ECG)patches may be placed on your chest. The electrodes or patches are then connected to a device that monitors your heart rate and rhythm. You will lie down on a table for an ultrasound exam. A gel will be applied to your chest to help sound waves pass through your skin. A handheld device, called a transducer, will be pressed against your chest and moved over your heart. The transducer produces sound waves that travel to your heart and bounce back (or "echo" back) to the transducer. These sound waves will be captured in real-time and changed into images of your heart that can be viewed on a video monitor. The images will be recorded on a computer and reviewed by your health care provider. You may be asked to change positions or hold your breath for a short time. This makes it  easier to get different views or better views of your heart. In some cases, you may receive contrast through an IV in one of your veins. This can improve the quality of the pictures from your heart. The procedure may vary among health care providers and hospitals.   What can I expect after the test? You may return to your normal, everyday life, including diet, activities, and medicines, unless your health care provider tells you not to do that. Follow these instructions at home: It is up to you to get the results of your test. Ask your health care provider, or the department that is doing the test, when your results will be ready. Keep all follow-up visits. This is important. Summary An echocardiogram is a test that uses sound waves (ultrasound) to produce images of the heart. Images from an echocardiogram can provide important information about the size and shape of your heart, heart muscle function, heart valve function, and other possible heart problems. You do not need to do anything to prepare before this test. You may eat and drink normally. After the echocardiogram is completed, you may return to your normal, everyday life, unless your health care provider tells you not to do that. This information is not  intended to replace advice given to you by your health care provider. Make sure you discuss any questions you have with your health care provider. Document Revised: 07/08/2020 Document Reviewed: 07/08/2020 Elsevier Patient Education  2021 [employer].

## 2022-08-14 LAB — SPECIMEN STATUS REPORT

## 2022-08-14 LAB — TSH: TSH: 2.23 u[IU]/mL (ref 0.450–4.500)

## 2022-08-18 NOTE — Progress Notes (Unsigned)
CC:  memory loss   Follow-up Visit   Last visit: 12/23/21   Brief HPI: 41 year old male with a history of anxiety, afib not on AC, hx of bilateral optic nerve atrophy who follows in clinic for memory loss.   At his last visit, EEG was ordered and he was referred to Sleep for OSA evaluation.   Interval History: *** EEG 12/28/21 showed left temporal sharp waves and focal slowing. He was started on Keppra but was unable to tolerate this due to irritability. He was switched to Lamictal***   PSG showed mild OSA and he was started on CPAP.***   Physical Exam:    Vital Signs: There were no vitals taken for this visit. GENERAL:  well appearing, in no acute distress, alert  SKIN:  Color, texture, turgor normal. No rashes or lesions HEAD:  Normocephalic/atraumatic. RESP: normal respiratory effort MSK:  No gross joint deformities.    NEUROLOGICAL: Mental Status: Alert, oriented to person, place and time, Follows commands, and Speech fluent and appropriate. Cranial Nerves: PERRL, face symmetric, no dysarthria, hearing grossly intact Motor: moves all extremities equally Gait: normal-based.   IMPRESSION: ***   PLAN: ***     Follow-up: ***   I spent a total of *** minutes on the date of the service. Discussed medication side effects, adverse reactions and drug interactions. Written educational materials and patient instructions outlining all of the above were given.   Genia Harold, MD

## 2022-08-19 ENCOUNTER — Ambulatory Visit (INDEPENDENT_AMBULATORY_CARE_PROVIDER_SITE_OTHER): Payer: BC Managed Care – PPO | Admitting: Psychiatry

## 2022-08-19 ENCOUNTER — Encounter: Payer: Self-pay | Admitting: Psychiatry

## 2022-08-19 VITALS — BP 129/86 | HR 74 | Ht 68.0 in | Wt 215.0 lb

## 2022-08-19 DIAGNOSIS — R413 Other amnesia: Secondary | ICD-10-CM | POA: Diagnosis not present

## 2022-08-24 ENCOUNTER — Telehealth: Payer: Self-pay | Admitting: Psychiatry

## 2022-08-24 NOTE — Telephone Encounter (Signed)
Referral faxed to [facility] phone # [phone].

## 2022-08-30 ENCOUNTER — Ambulatory Visit: Payer: PRIVATE HEALTH INSURANCE | Attending: Cardiology

## 2022-08-30 DIAGNOSIS — I480 Paroxysmal atrial fibrillation: Secondary | ICD-10-CM

## 2022-08-30 LAB — ECHOCARDIOGRAM COMPLETE
Area-P 1/2: 4.89 cm2
S' Lateral: 3.4 cm

## 2022-09-28 ENCOUNTER — Telehealth: Payer: BC Managed Care – PPO | Admitting: Psychiatry

## 2022-11-08 ENCOUNTER — Other Ambulatory Visit: Payer: Self-pay

## 2022-11-08 DIAGNOSIS — Z1331 Encounter for screening for depression: Secondary | ICD-10-CM | POA: Diagnosis not present

## 2022-11-08 DIAGNOSIS — Z23 Encounter for immunization: Secondary | ICD-10-CM | POA: Diagnosis not present

## 2022-11-08 DIAGNOSIS — Z6832 Body mass index (BMI) 32.0-32.9, adult: Secondary | ICD-10-CM | POA: Diagnosis not present

## 2022-11-08 DIAGNOSIS — Z0000 Encounter for general adult medical examination without abnormal findings: Secondary | ICD-10-CM | POA: Diagnosis not present

## 2022-11-08 DIAGNOSIS — E785 Hyperlipidemia, unspecified: Secondary | ICD-10-CM | POA: Diagnosis not present

## 2022-11-08 MED ORDER — LAMOTRIGINE 100 MG PO TABS: 100.0000 mg | ORAL_TABLET | Freq: Two times a day (BID) | ORAL | 5 refills | Status: DC

## 2022-11-30 ENCOUNTER — Other Ambulatory Visit: Payer: Self-pay | Admitting: Psychiatry

## 2022-11-30 ENCOUNTER — Encounter: Payer: Self-pay | Admitting: Psychiatry

## 2022-11-30 DIAGNOSIS — G40909 Epilepsy, unspecified, not intractable, without status epilepticus: Secondary | ICD-10-CM

## 2022-12-04 DIAGNOSIS — J0100 Acute maxillary sinusitis, unspecified: Secondary | ICD-10-CM | POA: Diagnosis not present

## 2022-12-09 DIAGNOSIS — J329 Chronic sinusitis, unspecified: Secondary | ICD-10-CM | POA: Diagnosis not present

## 2022-12-09 DIAGNOSIS — R066 Hiccough: Secondary | ICD-10-CM | POA: Diagnosis not present

## 2022-12-23 ENCOUNTER — Ambulatory Visit (INDEPENDENT_AMBULATORY_CARE_PROVIDER_SITE_OTHER): Payer: BC Managed Care – PPO | Admitting: Neurology

## 2022-12-23 DIAGNOSIS — G40909 Epilepsy, unspecified, not intractable, without status epilepticus: Secondary | ICD-10-CM

## 2022-12-23 NOTE — Procedures (Signed)
    History:  42 year old man with seizure disorder   EEG classification:  Awake and asleep  Description of the recording: The background rhythms of this recording consists of a fairly well modulated medium amplitude background activity of 10 Hz. As the record progresses, the patient initially is in the waking state, but appears to enter the early stage II sleep during the recording, with rudimentary sleep spindles and vertex sharp wave activity seen. During the wakeful state, photic stimulation is performed, and no abnormal responses were seen. Hyperventilation was also performed, no abnormal response seen. There were presence of left frontotemporal sharp and slow waves seen during this recording. There was no focal slowing.   Abnormality: Left frontotemporal sharp and slow waves.   Impression: This is an abnormal EEG recording in the waking and sleeping state due to presence of left frontotemporal sharp and slow waves which is consistent with an area of epileptogenic potential. No seizure seen during this recording.     Alric Ran, MD [facility]

## 2022-12-27 ENCOUNTER — Telehealth: Payer: Self-pay | Admitting: *Deleted

## 2022-12-27 NOTE — Telephone Encounter (Signed)
Sent mychart

## 2023-01-07 DIAGNOSIS — J329 Chronic sinusitis, unspecified: Secondary | ICD-10-CM | POA: Diagnosis not present

## 2023-01-07 DIAGNOSIS — J40 Bronchitis, not specified as acute or chronic: Secondary | ICD-10-CM | POA: Diagnosis not present

## 2023-01-17 DIAGNOSIS — H47033 Optic nerve hypoplasia, bilateral: Secondary | ICD-10-CM | POA: Diagnosis not present

## 2023-01-17 DIAGNOSIS — H524 Presbyopia: Secondary | ICD-10-CM | POA: Diagnosis not present

## 2023-02-25 IMAGING — CR DG CHEST 2V
2 series · 2 of 2 positions shown · non-contrast
Comparison: July 10, 2017

CLINICAL DATA: Chest pain and shortness of breath.

EXAM:
CHEST - 2 VIEW

[w chest pa]
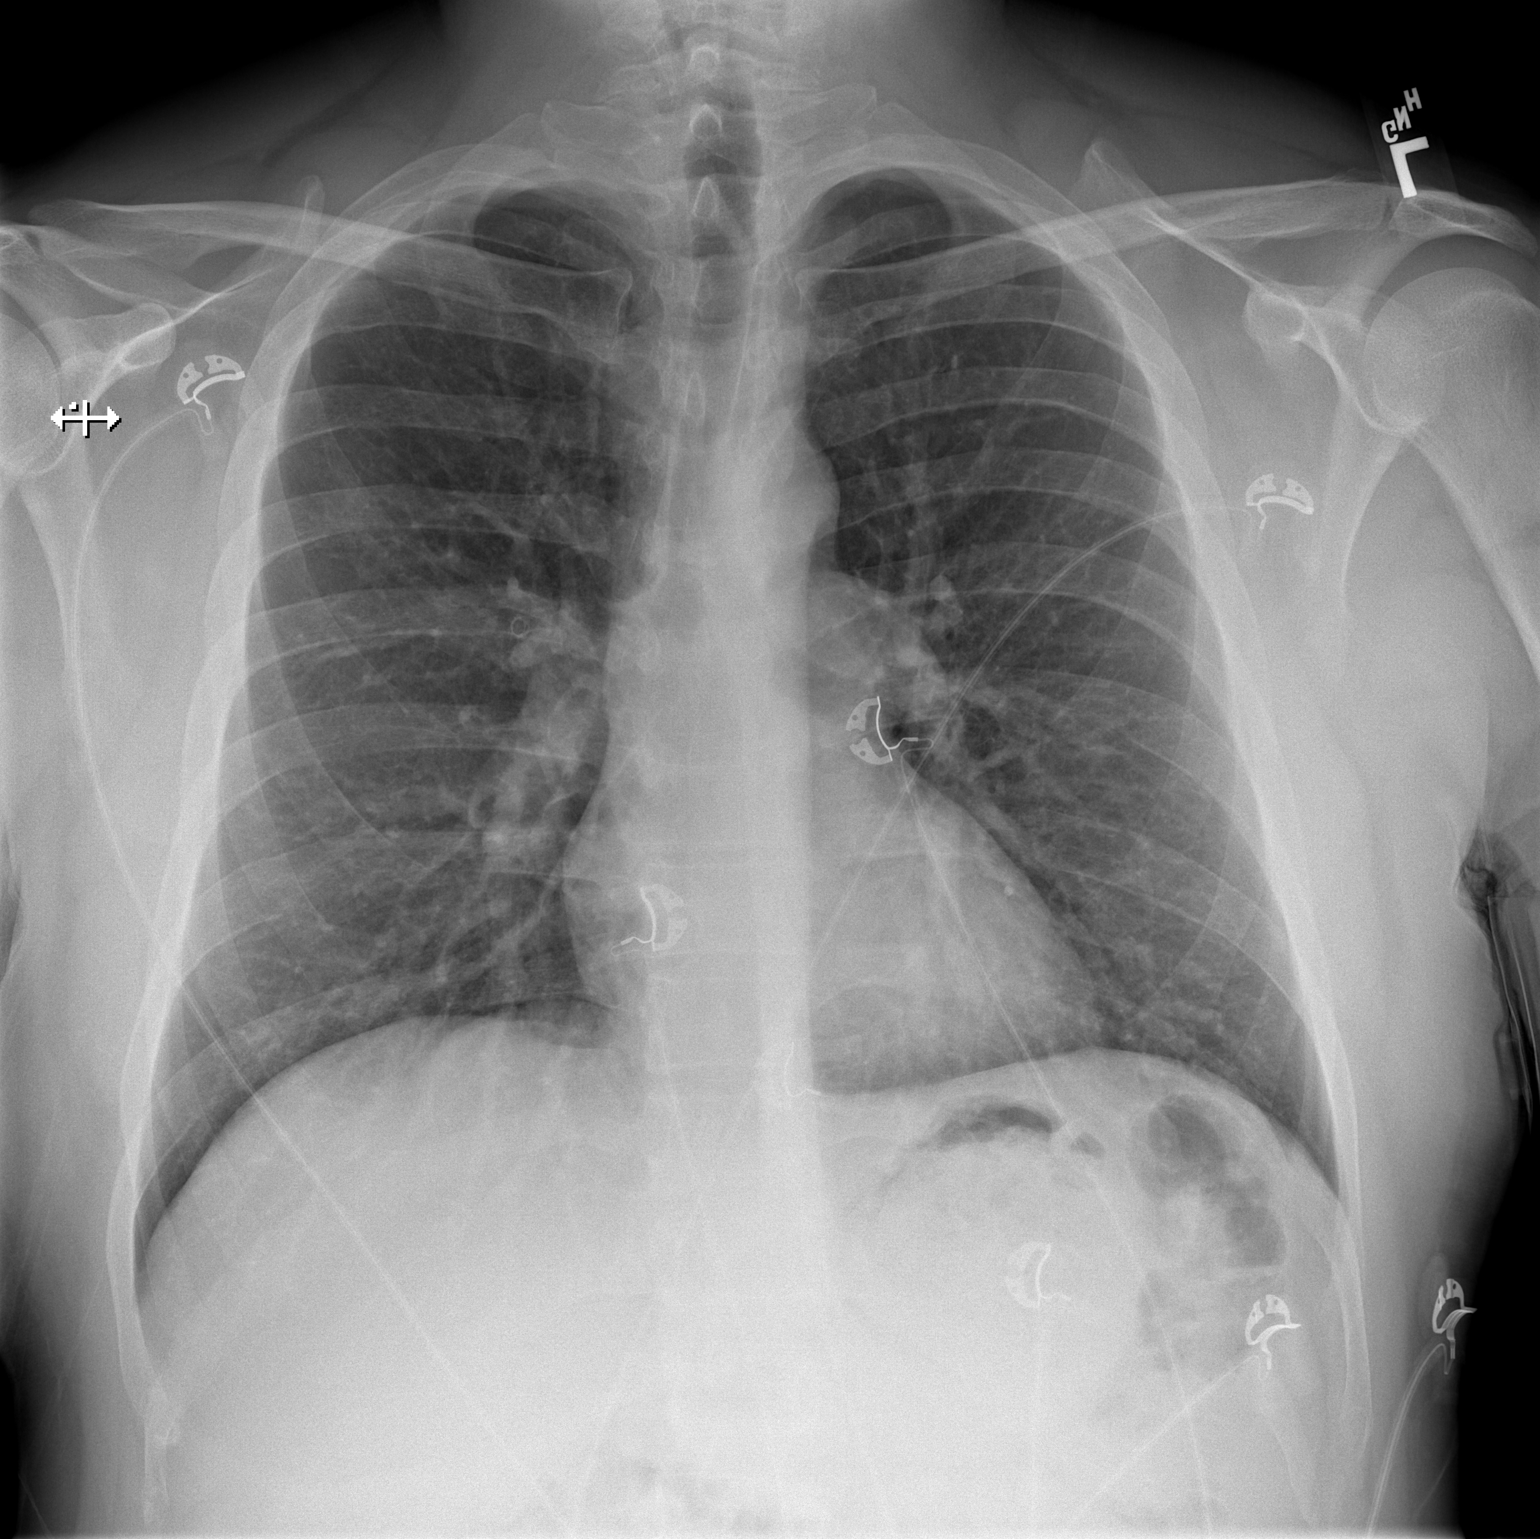

[w chest lat]
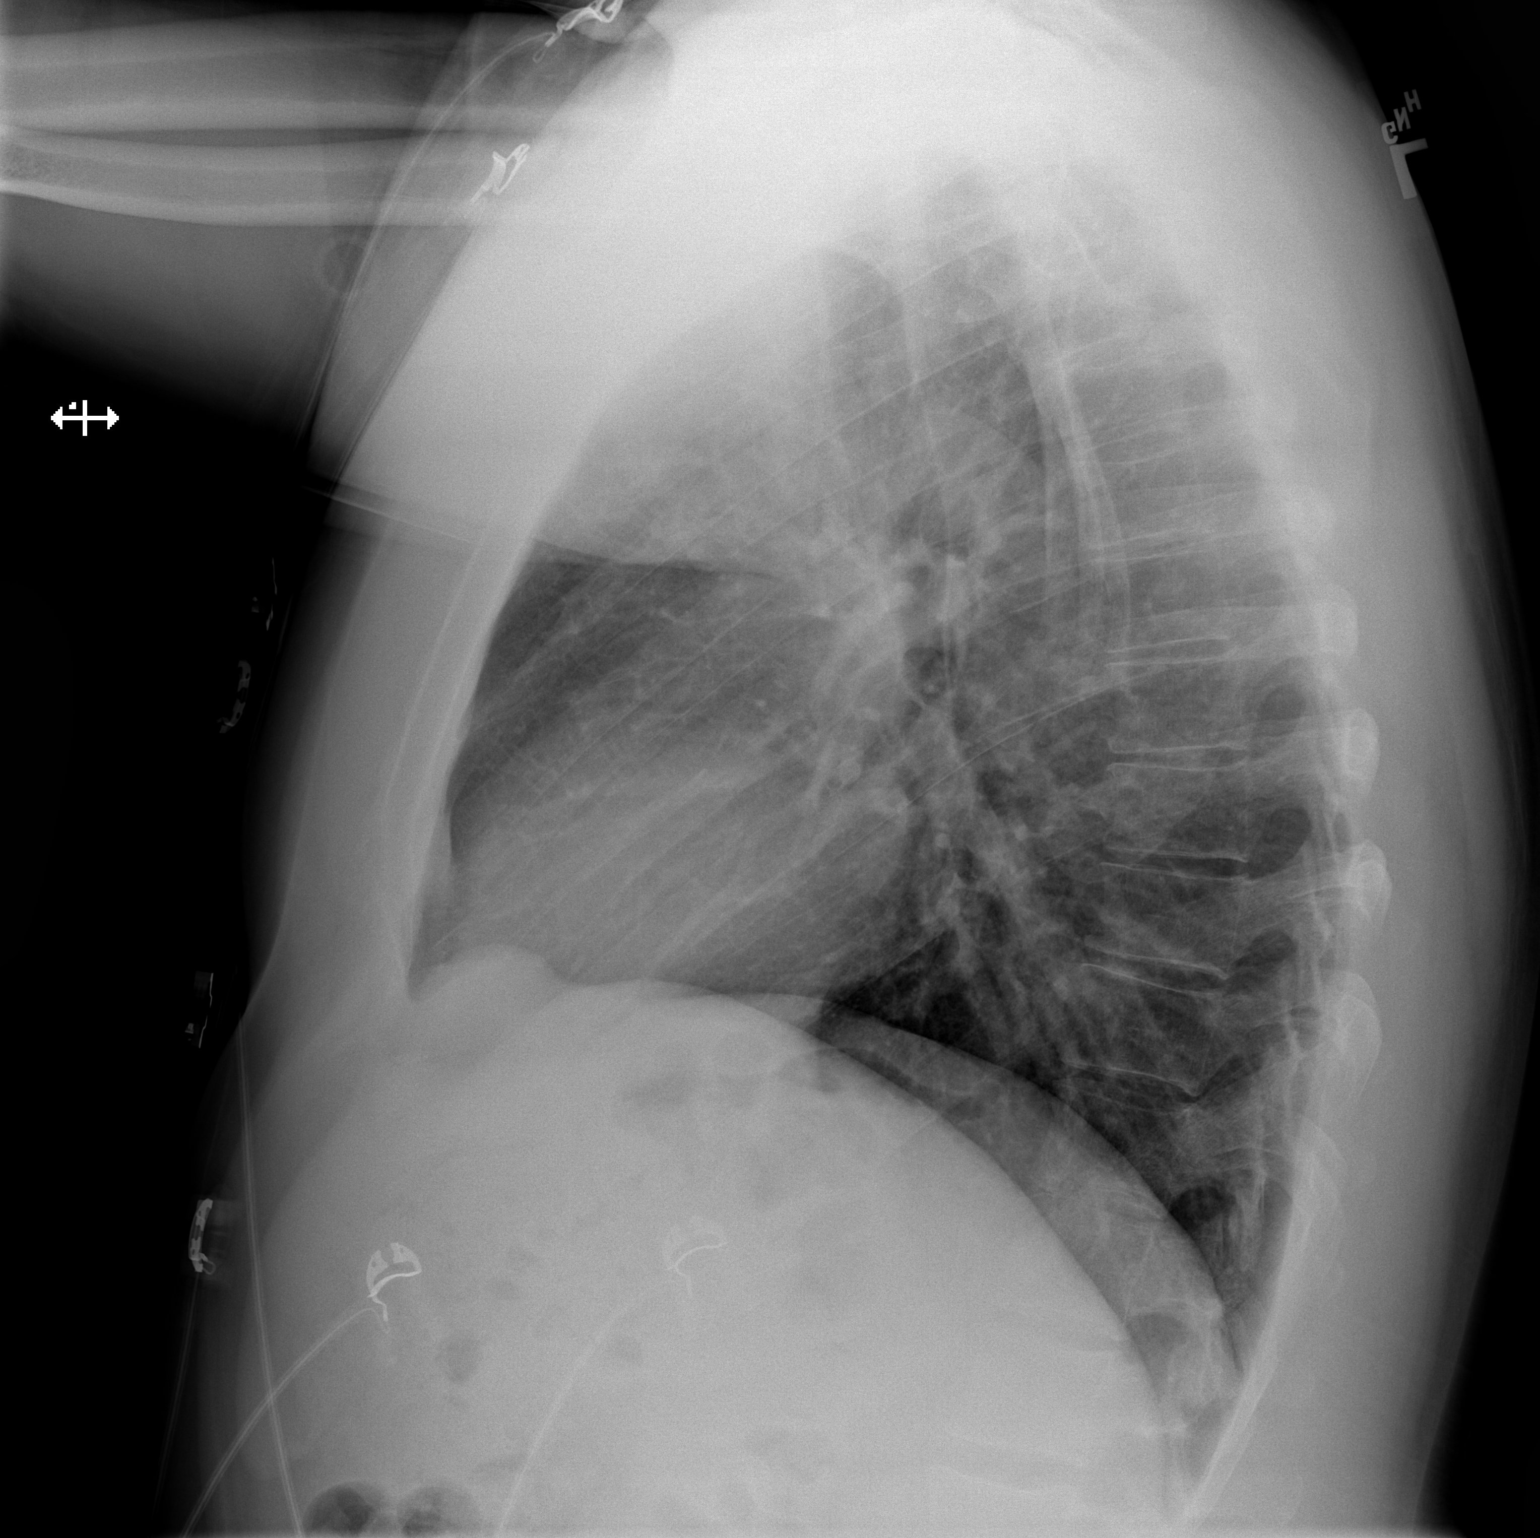

[2 of 2 positions shown; findings below may reference images not displayed]

FINDINGS: The heart size and mediastinal contours are within normal limits.
Both lungs are clear. The visualized skeletal structures are
unremarkable.
IMPRESSION: No active cardiopulmonary disease.

## 2023-03-14 DIAGNOSIS — J301 Allergic rhinitis due to pollen: Secondary | ICD-10-CM | POA: Diagnosis not present

## 2023-03-17 NOTE — Progress Notes (Deleted)
   CC:  memory loss, seizure disorder  Follow-up Visit  Last visit: 08/19/22  Brief HPI: 42 year old male with a history of OSA, PTSD, anxiety, afib not on AC, hx of bilateral optic nerve atrophy who follows in clinic for memory loss and seizure disorder. EEG for memory loss on 12/28/21 showed left temporal sharp waves and focal slowing.   At his last visit he was continued on Lamictal and referred for neuropsychological testing.  Interval History: Memory***lamictal***neuropsych   Physical Exam:   Vital Signs: There were no vitals taken for this visit. GENERAL:  well appearing, in no acute distress, alert  SKIN:  Color, texture, turgor normal. No rashes or lesions HEAD:  Normocephalic/atraumatic. RESP: normal respiratory effort MSK:  No gross joint deformities.   NEUROLOGICAL: Mental Status: Alert, oriented to person, place and time, Follows commands, and Speech fluent and appropriate. Cranial Nerves: PERRL, face symmetric, no dysarthria, hearing grossly intact Motor: moves all extremities equally Gait: normal-based.  IMPRESSION: ***  PLAN: ***   Follow-up: ***  I spent a total of *** minutes on the date of the service. Discussed medication side effects, adverse reactions and drug interactions. Written educational materials and patient instructions outlining all of the above were given.  Ocie Doyne, MD

## 2023-03-21 ENCOUNTER — Ambulatory Visit: Payer: BC Managed Care – PPO | Admitting: Psychiatry

## 2023-04-02 DIAGNOSIS — G4730 Sleep apnea, unspecified: Secondary | ICD-10-CM | POA: Diagnosis not present

## 2023-04-02 DIAGNOSIS — I4891 Unspecified atrial fibrillation: Secondary | ICD-10-CM | POA: Diagnosis not present

## 2023-04-02 DIAGNOSIS — G939 Disorder of brain, unspecified: Secondary | ICD-10-CM | POA: Diagnosis not present

## 2023-04-02 DIAGNOSIS — E789 Disorder of lipoprotein metabolism, unspecified: Secondary | ICD-10-CM | POA: Diagnosis not present

## 2023-04-08 DIAGNOSIS — R413 Other amnesia: Secondary | ICD-10-CM | POA: Diagnosis not present

## 2023-04-08 DIAGNOSIS — G4733 Obstructive sleep apnea (adult) (pediatric): Secondary | ICD-10-CM | POA: Diagnosis not present

## 2023-04-20 DIAGNOSIS — Z9229 Personal history of other drug therapy: Secondary | ICD-10-CM | POA: Diagnosis not present

## 2023-04-20 DIAGNOSIS — G4730 Sleep apnea, unspecified: Secondary | ICD-10-CM | POA: Diagnosis not present

## 2023-04-20 DIAGNOSIS — I4891 Unspecified atrial fibrillation: Secondary | ICD-10-CM | POA: Diagnosis not present

## 2023-05-12 NOTE — Progress Notes (Deleted)
No chief complaint on file.   HISTORY OF PRESENT ILLNESS:  05/12/23 ALL:  Jesus Higgins is a 42 y.o. male here today for follow up for seizures. He was last seen by Jesus Higgins 07/2022 and advised to continue lamotrigine 100mg  BID. Referral placed to neuropsychology for formal neurocognitive evaluation. He was advised to discuss untreated OSA and intolerance with CPAP with his sleep team and PTSD management with psychiatry. EEG 11/2022 did show presence of left frontotemporal sharp and slow waves, consistent with epileptogenic potential.   Since,   Ophthalmology? Optic nerve atrophy   HISTORY (copied from Jesus Higgins previous note)  42 year old male with a history of PTSD, anxiety, afib not on AC, hx of bilateral optic nerve atrophy who follows in clinic for memory loss.   At his last visit, EEG was ordered and he was referred to Sleep for OSA evaluation.   Interval History: EEG 12/28/21 showed left temporal sharp waves and focal slowing. He was started on Keppra but was unable to tolerate this due to irritability. He was switched to Lamictal. Has been tolerating this well without issues. Headaches have improved on lamotrigine.   PSG showed mild OSA and he was started on CPAP. He has not been using this as he could not tolerate it.   Feels his memory is about the same. No longer feels confused for days, but may have a couple of hours in a day where he feels confused. Does not have issues with immediate memory but may forget things that happened the previous day. He still needs to write things down to remember them.    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 [facility] with [facility]. He has been in a couple of incidents in the past 2 years which he thinks may have triggered his PTSD. Wonders if this may be contributing to his memory issues.   REVIEW OF SYSTEMS: Out of a complete 14 system review of symptoms, the  patient complains only of the following symptoms, and all other reviewed systems are negative.   ALLERGIES: No Known Allergies   HOME MEDICATIONS: Outpatient Medications Prior to Visit  Medication Sig Dispense Refill   aspirin EC 81 MG tablet Take 81 mg by mouth daily.     ibuprofen (ADVIL) 800 MG tablet Take 800 mg by mouth 3 (three) times daily as needed for pain or headache.     lamoTRIgine (LAMICTAL) 100 MG tablet Take 1 tablet (100 mg total) by mouth 2 (two) times daily. 60 tablet 5   No facility-administered medications prior to visit.     PAST MEDICAL HISTORY: 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   Memory loss 02/14/2022   Mild obstructive sleep apnea    OSA (obstructive sleep apnea) 02/14/2022   Paroxysmal atrial fibrillation (HCC)    Sleep disorder 12/09/2017   Snoring 02/14/2022     PAST SURGICAL HISTORY: Past Surgical History:  Procedure Laterality Date   NO PAST SURGERIES       FAMILY HISTORY: Family History  Problem Relation Age of Onset   Cancer Mother        breast   Early death Mother  Hypertension Father    Hyperlipidemia Father    Hypertension Maternal Grandmother    Cancer Maternal Aunt    Heart disease Maternal Aunt      SOCIAL HISTORY: Social History   Socioeconomic History   Marital status: Married    Spouse name: Jesus Higgins   Number of children: 2   Years of education: Not on file   Highest education level: Not on file  Occupational History   Not on file  Tobacco Use   Smoking status: Never   Smokeless tobacco: Never  Vaping Use   Vaping Use: Never used  Substance and Sexual Activity   Alcohol use: Yes    Comment: Rare, special occasion   Drug use: Never   Sexual activity: Not on file  Other Topics Concern   Not on  file  Social History Narrative   Lives with family   Right handed   Caffeine: 2 cup of coffee in a day, energy drink 1-2x a week.    Social Determinants of Health   Financial Resource Strain: Not on file  Food Insecurity: Not on file  Transportation Needs: Not on file  Physical Activity: Not on file  Stress: Not on file  Social Connections: Not on file  Intimate Partner Violence: Not on file     PHYSICAL EXAM  There were no vitals filed for this visit. There is no height or weight on file to calculate BMI.  Generalized: Well developed, in no acute distress  Cardiology: normal rate and rhythm, no murmur auscultated  Respiratory: clear to auscultation bilaterally    Neurological examination  Mentation: Alert oriented to time, place, history taking. Follows all commands speech and language fluent Cranial nerve II-XII: Pupils were equal round reactive to light. Extraocular movements were full, visual field were full on confrontational test. Facial sensation and strength were normal. Uvula tongue midline. Head turning and shoulder shrug  were normal and symmetric. Motor: The motor testing reveals 5 over 5 strength of all 4 extremities. Good symmetric motor tone is noted throughout.  Sensory: Sensory testing is intact to soft touch on all 4 extremities. No evidence of extinction is noted.  Coordination: Cerebellar testing reveals good finger-nose-finger and heel-to-shin bilaterally.  Gait and station: Gait is normal. Tandem gait is normal. Romberg is negative. No drift is seen.  Reflexes: Deep tendon reflexes are symmetric and normal bilaterally.    DIAGNOSTIC DATA (LABS, IMAGING, TESTING) - I reviewed patient records, labs, notes, testing and imaging myself where available.  Lab Results  Component Value Date   WBC 8.7 11/28/2021   HGB 13.7 11/28/2021   HCT 39.5 11/28/2021   MCV 88.2 11/28/2021   PLT 285 11/28/2021      Component Value Date/Time   NA 136 11/28/2021 1511    K 3.8 11/28/2021 1511   CL 102 11/28/2021 1511   CO2 29 11/28/2021 1511   GLUCOSE 95 11/28/2021 1511   BUN 19 11/28/2021 1511   CREATININE 0.93 11/28/2021 1511   CALCIUM 8.7 (L) 11/28/2021 1511   GFRNONAA >60 11/28/2021 1511   No results found for: "CHOL", "HDL", "LDLCALC", "LDLDIRECT", "TRIG", "CHOLHDL" No results found for: "HGBA1C" Lab Results  Component Value Date   VITAMINB12 472 12/23/2021   Lab Results  Component Value Date   TSH 2.230 08/13/2022       08/19/2022    8:14 AM 12/23/2021    8:24 AM  MMSE - Mini Mental State Exam  Orientation to time 5 5  Orientation to Place  5 5  Registration 3 3  Attention/ Calculation 3 5  Recall 3 3  Language- name 2 objects 2 2  Language- repeat 1 1  Language- follow 3 step command 3 3  Language- read & follow direction 1 1  Write a sentence 1 1  Copy design 1 1  Total score 28 30         No data to display           ASSESSMENT AND PLAN  42 y.o. year old male  has a past medical history of Abnormal electroencephalogram (EEG) (02/14/2022), Abnormal MRI, Allergy, Anxiety (12/09/2017), BMI 32.0-32.9,adult, Confusion, Fatigue (12/09/2017), Loud snoring (02/14/2022), Mediastinal cyst (01/09/2019), Memory loss (02/14/2022), Mild obstructive sleep apnea, OSA (obstructive sleep apnea) (02/14/2022), Paroxysmal atrial fibrillation (HCC), Sleep disorder (12/09/2017), and Snoring (02/14/2022). here with    No diagnosis found.  Sima Matas ***.  Healthy lifestyle habits encouraged. *** will follow up with PCP as directed. *** will return to see me in ***, sooner if needed. *** verbalizes understanding and agreement with this plan.   No orders of the defined types were placed in this encounter.    No orders of the defined types were placed in this encounter.    Shawnie Dapper, MSN, FNP-C 05/12/2023, 10:01 AM  [facility] [address] [place], [place] [address] [phone]

## 2023-05-16 ENCOUNTER — Ambulatory Visit: Payer: BC Managed Care – PPO | Admitting: Family Medicine

## 2023-05-16 DIAGNOSIS — H4720 Unspecified optic atrophy: Secondary | ICD-10-CM

## 2023-05-16 DIAGNOSIS — G40909 Epilepsy, unspecified, not intractable, without status epilepticus: Secondary | ICD-10-CM

## 2023-05-24 DIAGNOSIS — R419 Unspecified symptoms and signs involving cognitive functions and awareness: Secondary | ICD-10-CM | POA: Diagnosis not present

## 2023-05-24 DIAGNOSIS — G4733 Obstructive sleep apnea (adult) (pediatric): Secondary | ICD-10-CM | POA: Diagnosis not present

## 2023-05-24 DIAGNOSIS — F4310 Post-traumatic stress disorder, unspecified: Secondary | ICD-10-CM | POA: Diagnosis not present

## 2023-05-24 DIAGNOSIS — Z87820 Personal history of traumatic brain injury: Secondary | ICD-10-CM | POA: Diagnosis not present

## 2023-05-30 DIAGNOSIS — F32A Depression, unspecified: Secondary | ICD-10-CM | POA: Diagnosis not present

## 2023-05-30 DIAGNOSIS — F4312 Post-traumatic stress disorder, chronic: Secondary | ICD-10-CM | POA: Diagnosis not present

## 2023-05-30 DIAGNOSIS — F902 Attention-deficit hyperactivity disorder, combined type: Secondary | ICD-10-CM | POA: Diagnosis not present

## 2023-07-22 DIAGNOSIS — G4733 Obstructive sleep apnea (adult) (pediatric): Secondary | ICD-10-CM | POA: Diagnosis not present

## 2023-07-26 DIAGNOSIS — G4733 Obstructive sleep apnea (adult) (pediatric): Secondary | ICD-10-CM | POA: Diagnosis not present

## 2023-08-03 DIAGNOSIS — H527 Unspecified disorder of refraction: Secondary | ICD-10-CM | POA: Diagnosis not present

## 2023-08-04 DIAGNOSIS — R419 Unspecified symptoms and signs involving cognitive functions and awareness: Secondary | ICD-10-CM | POA: Diagnosis not present

## 2023-09-19 DIAGNOSIS — Z0189 Encounter for other specified special examinations: Secondary | ICD-10-CM | POA: Diagnosis not present

## 2023-10-28 DIAGNOSIS — G4733 Obstructive sleep apnea (adult) (pediatric): Secondary | ICD-10-CM | POA: Diagnosis not present

## 2023-11-07 DIAGNOSIS — G4733 Obstructive sleep apnea (adult) (pediatric): Secondary | ICD-10-CM | POA: Diagnosis not present

## 2023-11-08 DIAGNOSIS — G4733 Obstructive sleep apnea (adult) (pediatric): Secondary | ICD-10-CM | POA: Diagnosis not present

## 2023-12-05 DIAGNOSIS — G4733 Obstructive sleep apnea (adult) (pediatric): Secondary | ICD-10-CM | POA: Diagnosis not present

## 2023-12-15 ENCOUNTER — Encounter (HOSPITAL_BASED_OUTPATIENT_CLINIC_OR_DEPARTMENT_OTHER): Payer: Self-pay

## 2023-12-15 ENCOUNTER — Ambulatory Visit (HOSPITAL_BASED_OUTPATIENT_CLINIC_OR_DEPARTMENT_OTHER)
Admission: RE | Admit: 2023-12-15 | Discharge: 2023-12-15 | Disposition: A | Discharge status: Home / Self Care | Payer: No Typology Code available for payment source | Source: Ambulatory Visit

## 2023-12-15 VITALS — BP 138/95 | HR 128 | Temp 99.6°F | Resp 18

## 2023-12-15 DIAGNOSIS — R509 Fever, unspecified: Secondary | ICD-10-CM

## 2023-12-15 DIAGNOSIS — R051 Acute cough: Secondary | ICD-10-CM

## 2023-12-15 DIAGNOSIS — J029 Acute pharyngitis, unspecified: Secondary | ICD-10-CM

## 2023-12-15 DIAGNOSIS — J208 Acute bronchitis due to other specified organisms: Secondary | ICD-10-CM

## 2023-12-15 LAB — POCT RAPID STREP A (OFFICE): Rapid Strep A Screen: NEGATIVE

## 2023-12-15 MED ORDER — PROMETHAZINE-DM 6.25-15 MG/5ML PO SYRP: 5.0000 mL | ORAL_SOLUTION | Freq: Four times a day (QID) | ORAL | 0 refills | Status: AC | PRN

## 2023-12-15 MED ORDER — BENZONATATE 200 MG PO CAPS: 200.0000 mg | ORAL_CAPSULE | Freq: Three times a day (TID) | ORAL | 0 refills | Status: AC | PRN

## 2023-12-15 MED ORDER — ALBUTEROL SULFATE HFA 108 (90 BASE) MCG/ACT IN AERS: 2.0000 | INHALATION_SPRAY | RESPIRATORY_TRACT | 0 refills | Status: AC | PRN

## 2023-12-15 MED ORDER — TRIAMCINOLONE ACETONIDE 40 MG/ML IJ SUSP
40.0000 mg | Freq: Once | INTRAMUSCULAR | Status: AC
  Administered 2023-12-15: 40 mg via INTRAMUSCULAR

## 2023-12-15 MED ORDER — IPRATROPIUM-ALBUTEROL 0.5-2.5 (3) MG/3ML IN SOLN
3.0000 mL | Freq: Once | RESPIRATORY_TRACT | Status: AC
  Administered 2023-12-15: 3 mL via RESPIRATORY_TRACT

## 2023-12-15 NOTE — Discharge Instructions (Addendum)
Symptoms have persisted for 4 to 5 days.  Flu and COVID testing not done.  Rapid strep is negative.  Exam is most consistent with viral bronchitis (likely flu, given his exposures).  It is past the point where Tamiflu would be helpful.  Breathing significantly improved after DuoNeb treatment.  Kenalog, 40 mg, injected in the office.  Provided albuterol inhaler, 2 puffs, every 4 hours, as needed for cough or wheezing.  Provided benzonatate, 200 mg, 1 pill, every 8 hours as needed for cough. May use while driving.  Promethazine DM, 1 teaspoon, every 6 hours, as needed for cough.  Do not use the promethazine and drive.  Do not take promethazine DM and benzonatate at the same time.  Get plenty of fluids.  Get plenty of rest.  Follow-up if symptoms do not improve, worsen, or new symptoms occur.  Declined a work excuse.  Neck step is blood work and chest x-ray if symptoms do not improve.

## 2023-12-15 NOTE — ED Triage Notes (Signed)
Pt c/o 4 days ago he started coughing, congestion, sore throat, headache. Pt has taken Mucinex and motrin. Pt had Albuterol at home and did take one dose and he stated it helped loosen it up.

## 2023-12-15 NOTE — ED Provider Notes (Signed)
[name]    CSN: [id number] Arrival date & time: 12/15/23  1737      History   Chief Complaint Chief Complaint  Patient presents with   Cough   Shortness of Breath    HPI Jesus Higgins is a 43 y.o. male.   Here with complaint of exposure to influenza with 2 coworkers.  He has had a cough, chest congestion, head congestion, sore throat, tightness when he breathes for 3 to 5 days.  His son was treated a week ago for strep throat and pneumonia.  He is really mostly concerned about the cough and chest congestion.  Patient had a Dexamethasone shot several years ago.  It caused Hiccups that lasted for over 4 days.  He had to have Baclofen to stop the Hiccups.  He has had both oral Prednisone and Kenalog shots and tolerated both without side effects.   Cough Associated symptoms: shortness of breath, sore throat and wheezing   Associated symptoms: no chest pain, no chills, no ear pain, no fever and no rash   Shortness of Breath Associated symptoms: cough, sore throat and wheezing   Associated symptoms: no abdominal pain, no chest pain, no ear pain, no fever, no rash and no vomiting     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   Memory loss 02/14/2022   Mild obstructive sleep apnea    OSA (obstructive sleep apnea) 02/14/2022   Paroxysmal atrial fibrillation (HCC)    Sleep disorder 12/09/2017   Snoring 02/14/2022    Patient Active Problem List   Diagnosis Date Noted   Obesity (BMI 30.0-34.9) 08/13/2022   Abnormal MRI 08/12/2022   Allergy 08/12/2022   BMI 32.0-32.9,adult 08/12/2022   Confusion 08/12/2022   Mild obstructive sleep apnea 08/12/2022   Paroxysmal atrial fibrillation (HCC) 02/14/2022   Memory loss 02/14/2022   Snoring 02/14/2022   Loud snoring  02/14/2022   OSA (obstructive sleep apnea) 02/14/2022   Abnormal electroencephalogram (EEG) 02/14/2022   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 Authorizing Provider  albuterol (VENTOLIN HFA) 108 (90 Base) MCG/ACT inhaler Inhale 2 puffs into the lungs every 4 (four) hours as needed for wheezing or shortness of breath. 12/15/23  Yes Prescilla Sours, FNP  benzonatate (TESSALON) 200 MG capsule Take 1 capsule (200 mg total) by mouth 3 (three) times daily as needed for cough. 12/15/23  Yes Prescilla Sours, FNP  escitalopram (LEXAPRO) 10 MG tablet Take by mouth. 09/19/23  Yes [provider]  promethazine-dextromethorphan (PROMETHAZINE-DM) 6.25-15 MG/5ML syrup Take 5 mLs by mouth 4 (four) times daily as needed for cough. May make drowsy.  Do not use and drive. 12/15/23  Yes Prescilla Sours, FNP  aspirin EC 81 MG tablet Take 81 mg by mouth daily.    [provider]  ibuprofen (ADVIL) 800 MG tablet Take 800 mg by mouth 3 (three) times daily as needed for pain or headache. 01/06/20   [provider]    Family History  Family History  Problem Relation Age of Onset   Cancer Mother        breast   Early death Mother    Hypertension Father    Hyperlipidemia Father    Hypertension Maternal Grandmother    Cancer Maternal Aunt    Heart disease Maternal Aunt     Social History Social History   Tobacco Use   Smoking status: Never   Smokeless tobacco: Never  Vaping Use   Vaping status: Never Used  Substance Use Topics   Alcohol use: Yes    Comment: Rare, special occasion   Drug use: Never     Allergies   Dexamethasone   Review of Systems Review of Systems  Constitutional:  Negative for chills and fever.  HENT:  Positive for  sore throat. Negative for ear pain.   Eyes:  Negative for pain and visual disturbance.  Respiratory:  Positive for cough, chest tightness, shortness of breath and wheezing.   Cardiovascular:  Negative for chest pain and palpitations.  Gastrointestinal:  Negative for abdominal pain and vomiting.  Genitourinary:  Negative for dysuria and hematuria.  Musculoskeletal:  Negative for arthralgias and back pain.  Skin:  Negative for color change and rash.  Neurological:  Negative for seizures and syncope.  All other systems reviewed and are negative.    Physical Exam Triage Vital Signs ED Triage Vitals  Encounter Vitals Group     BP 12/15/23 1813 (!) 138/95     Systolic BP Percentile --      Diastolic BP Percentile --      Pulse Rate 12/15/23 1813 (!) 106     Resp 12/15/23 1813 18     Temp 12/15/23 1813 99.8 F (37.7 C)     Temp Source 12/15/23 1813 Oral     SpO2 12/15/23 1813 93 %     Weight --      Height --      Head Circumference --      Peak Flow --      Pain Score 12/15/23 1811 0     Pain Loc --      Pain Education --      Exclude from Growth Chart --    No data found.  Updated Vital Signs BP (!) 138/95 (BP Location: Right Arm)   Pulse (!) 128   Temp 99.6 F (37.6 C) (Oral)   Resp 18   SpO2 98%   Visual Acuity Right Eye Distance:   Left Eye Distance:   Bilateral Distance:    Right Eye Near:   Left Eye Near:    Bilateral Near:     Physical Exam Vitals and nursing note reviewed.  Constitutional:      General: He is not in acute distress.    Appearance: He is well-developed. He is ill-appearing. He is not toxic-appearing.  HENT:     Head: Normocephalic and atraumatic.     Right Ear: Hearing, tympanic membrane, ear canal and external ear normal.     Left Ear: Hearing, tympanic membrane, ear canal and external ear normal.     Nose: Mucosal edema, congestion and rhinorrhea present. Rhinorrhea is clear.     Right Sinus: No maxillary sinus tenderness or frontal  sinus tenderness.     Left Sinus: No maxillary sinus tenderness or frontal sinus tenderness.     Mouth/Throat:     Lips: Pink.     Mouth: Mucous membranes are moist.     Pharynx: Uvula midline. Posterior oropharyngeal  erythema present. No oropharyngeal exudate.     Tonsils: No tonsillar exudate.  Eyes:     Conjunctiva/sclera: Conjunctivae normal.     Pupils: Pupils are equal, round, and reactive to light.  Cardiovascular:     Rate and Rhythm: Normal rate and regular rhythm.     Heart sounds: Normal heart sounds, S1 normal and S2 normal. No murmur heard. Pulmonary:     Effort: Pulmonary effort is normal. No respiratory distress.     Breath sounds: Examination of the right-upper field reveals wheezing. Examination of the left-upper field reveals wheezing. Examination of the right-middle field reveals wheezing. Examination of the left-middle field reveals wheezing. Examination of the right-lower field reveals wheezing. Examination of the left-lower field reveals wheezing. Wheezing present. No decreased breath sounds, rhonchi or rales.     Comments: Reassessment of breath sounds after DuoNeb treatment: Continued with some wheezing but overall breath sounds are significantly improved.  Oxygen saturation increased from 91% to 98%.  His heart rate did jump up but he felt like the treatment, hyped him up. Abdominal:     Palpations: Abdomen is soft.     Tenderness: There is no abdominal tenderness.  Musculoskeletal:        General: No swelling.     Cervical back: Neck supple.  Lymphadenopathy:     Head:     Right side of head: Tonsillar adenopathy present. No submental, submandibular, preauricular or posterior auricular adenopathy.     Left side of head: Tonsillar adenopathy present. No submental, submandibular, preauricular or posterior auricular adenopathy.     Cervical: Cervical adenopathy present.     Right cervical: Superficial cervical adenopathy present.     Left cervical: Superficial  cervical adenopathy present.  Skin:    General: Skin is warm and dry.     Capillary Refill: Capillary refill takes less than 2 seconds.     Findings: No rash.  Neurological:     Mental Status: He is alert and oriented to person, place, and time.  Psychiatric:        Mood and Affect: Mood normal.      UC Treatments / Results  Labs (all labs ordered are listed, but only abnormal results are displayed) Labs Reviewed  POCT RAPID STREP A (OFFICE) - Normal    EKG   Radiology No results found.  Procedures Procedures (including critical care time)  Medications Ordered in UC Medications  ipratropium-albuterol (DUONEB) 0.5-2.5 (3) MG/3ML nebulizer solution 3 mL (3 mLs Nebulization Given 12/15/23 1836)  triamcinolone acetonide (KENALOG-40) injection 40 mg (40 mg Intramuscular Given 12/15/23 1853)    Initial Impression / Assessment and Plan / UC Course  I have reviewed the triage vital signs and the nursing notes.  Pertinent labs & imaging results that were available during my care of the patient were reviewed by me and considered in my medical decision making (see chart for details).  Rapid strep is negative.  Flu and COVID testing not done due to the duration of his symptoms and they would not change therapy at this point.  Exam is most consistent with viral bronchitis, probably secondary to influenza.  Significant improvement in breathing with DuoNeb treatment.  Albuterol, 2 puffs, every 4 hours, as needed for cough or wheezing.  Kenalog 40 mg IM now.  Promethazine DM, 5 mL, every 6 hours, as needed for cough.  Promethazine DM may make drowsy, do not use and drive.  Benzonatate, 200 mg, 1 every 8 hours, as needed for cough.  Benzonatate may be used during working hours.  Do not take benzonatate and Promethazine DM at the same time.  Get plenty of fluids, and plenty of rest.  Follow-up if symptoms do not improve, worsen, or new symptoms occur.  Neck step would be lab work and chest  x-ray if symptoms or not improving. Final Clinical Impressions(s) / UC Diagnoses   Final diagnoses:  Fever, unspecified  Sore throat  Acute cough  Acute viral bronchitis     Discharge Instructions      Symptoms have persisted for 4 to 5 days.  Flu and COVID testing not done.  Rapid strep is negative.  Exam is most consistent with viral bronchitis (likely flu, given his exposures).  It is past the point where Tamiflu would be helpful.  Breathing significantly improved after DuoNeb treatment.  Kenalog, 40 mg, injected in the office.  Provided albuterol inhaler, 2 puffs, every 4 hours, as needed for cough or wheezing.  Provided benzonatate, 200 mg, 1 pill, every 8 hours as needed for cough. May use while driving.  Promethazine DM, 1 teaspoon, every 6 hours, as needed for cough.  Do not use the promethazine and drive.  Do not take promethazine DM and benzonatate at the same time.  Get plenty of fluids.  Get plenty of rest.  Follow-up if symptoms do not improve, worsen, or new symptoms occur.  Declined a work excuse.  Neck step is blood work and chest x-ray if symptoms do not improve.     ED Prescriptions     Medication Sig Dispense Auth. Provider   promethazine-dextromethorphan (PROMETHAZINE-DM) 6.25-15 MG/5ML syrup Take 5 mLs by mouth 4 (four) times daily as needed for cough. May make drowsy.  Do not use and drive. 118 mL Prescilla Sours, FNP   benzonatate (TESSALON) 200 MG capsule Take 1 capsule (200 mg total) by mouth 3 (three) times daily as needed for cough. 20 capsule Prescilla Sours, FNP   albuterol (VENTOLIN HFA) 108 (90 Base) MCG/ACT inhaler Inhale 2 puffs into the lungs every 4 (four) hours as needed for wheezing or shortness of breath. 1 each Prescilla Sours, FNP      PDMP not reviewed this encounter.   Prescilla Sours, FNP 12/15/23 [phone]

## 2023-12-17 ENCOUNTER — Encounter (HOSPITAL_BASED_OUTPATIENT_CLINIC_OR_DEPARTMENT_OTHER): Payer: Self-pay

## 2023-12-17 ENCOUNTER — Ambulatory Visit (HOSPITAL_BASED_OUTPATIENT_CLINIC_OR_DEPARTMENT_OTHER)
Admission: EM | Admit: 2023-12-17 | Discharge: 2023-12-17 | Disposition: A | Discharge status: Home / Self Care | Payer: BC Managed Care – PPO | Attending: Family Medicine | Admitting: Family Medicine

## 2023-12-17 DIAGNOSIS — R062 Wheezing: Secondary | ICD-10-CM

## 2023-12-17 DIAGNOSIS — J4521 Mild intermittent asthma with (acute) exacerbation: Secondary | ICD-10-CM | POA: Diagnosis not present

## 2023-12-17 MED ORDER — PREDNISONE 20 MG PO TABS: ORAL_TABLET | ORAL | 0 refills | Status: AC

## 2023-12-17 MED ORDER — DOXYCYCLINE HYCLATE 100 MG PO CAPS: 100.0000 mg | ORAL_CAPSULE | Freq: Two times a day (BID) | ORAL | 0 refills | Status: AC

## 2023-12-17 MED ORDER — IPRATROPIUM-ALBUTEROL 0.5-2.5 (3) MG/3ML IN SOLN
3.0000 mL | Freq: Once | RESPIRATORY_TRACT | Status: AC
  Administered 2023-12-17: 3 mL via RESPIRATORY_TRACT

## 2023-12-17 MED ORDER — IPRATROPIUM-ALBUTEROL 0.5-2.5 (3) MG/3ML IN SOLN: 3.0000 mL | Freq: Four times a day (QID) | RESPIRATORY_TRACT | 0 refills | Status: AC | PRN

## 2023-12-17 NOTE — Discharge Instructions (Addendum)
Use the ipratropium-albuterol in the nebulizer every 6 hours as needed for shortness of breath or wheezing.  You can use the albuterol inhaler in between if needed.  Take prednisone 20 mg--3 tabs daily x3 days, then 2 tabs daily x3 days, then 1 tab daily x3 days, then one half tab daily x3 days, then stop  Take doxycycline 100 mg --1 capsule 2 times daily for 7 days  You can go to [facility] on [address] to get your chest x-ray today.  They are open 730-5 on Saturdays.  Or you can get it done here on January 20.

## 2023-12-17 NOTE — ED Triage Notes (Signed)
Seen 1/16 for cough, congestion. Given neb treatment, sent home with inhaler, tesselon pearles. Patient has hx of afib as well. States sob today. Spouse is nurse and checked patient's O2 sat at home. Patient reports it was "in the 80's".

## 2023-12-17 NOTE — ED Provider Notes (Signed)
[name]    CSN: [id number] Arrival date & time: 12/17/23  1422      History   Chief Complaint Chief Complaint  Patient presents with   Cough    HPI Jesus Higgins is a 43 y.o. male.    Cough Here for continued cough and feeling short of breath.  He first got sick on January 12 and was seen here January 16.  He was given a Kenalog shot, and was also prescribed albuterol and Tessalon Perles.  He had had a DuoNeb treatment that gave him the best relief.  He does not really feel that the triamcinolone shot given much relief.  He was at home and his wife who is an ER nurse had checked his oxygen saturation on room air and it was in the 80s.  Here it is better than that somewhere between 90 and 94%.  He does have a history of atrial fibrillation and usually he feels dizzy or has palpitations when that is going on.  He has not had the symptoms so far.  He is allergic to dexamethasone which causes hiccups that are protracted.  He has tolerated triamcinolone injection and prednisone orally in the past.  His temperature was 99.6 when he was seen here in January 16, and that was after taking some ibuprofen recently before.  He had been exposed to the flu prior to getting ill, but he was seen on day 4 of illness.  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   Memory loss 02/14/2022   Mild obstructive sleep apnea    OSA (obstructive sleep apnea) 02/14/2022   Paroxysmal atrial fibrillation (HCC)    Sleep disorder 12/09/2017   Snoring 02/14/2022    Patient Active Problem List   Diagnosis Date Noted   Obesity (BMI 30.0-34.9) 08/13/2022   Abnormal MRI 08/12/2022   Allergy 08/12/2022   BMI 32.0-32.9,adult 08/12/2022   Confusion 08/12/2022   Mild obstructive sleep apnea  08/12/2022   Paroxysmal atrial fibrillation (HCC) 02/14/2022   Memory loss 02/14/2022   Snoring 02/14/2022   Loud snoring 02/14/2022   OSA (obstructive sleep apnea) 02/14/2022   Abnormal electroencephalogram (EEG) 02/14/2022   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 Authorizing Provider  doxycycline (VIBRAMYCIN) 100 MG capsule Take 1 capsule (100 mg total) by mouth 2 (two) times daily for 7 days. 12/17/23 12/24/23 Yes Keyerra Lamere, Janace Aris, MD  ipratropium-albuterol (DUONEB) 0.5-2.5 (3) MG/3ML SOLN Take 3 mLs by nebulization every 6 (six) hours as needed. 12/17/23  Yes Zenia Resides, MD  lamoTRIgine (LAMICTAL) 100 MG tablet Take 100 mg by mouth 2 (two) times daily.   Yes [provider]  predniSONE (DELTASONE) 20 MG tablet 3 tabs daily x3 days, then 2 tabs daily x3 days, then 1 tab daily x3 days, then one half tab daily x3 days, then stop 12/17/23  Yes Nellie Chevalier, Janace Aris, MD  albuterol (VENTOLIN HFA) 108 (90 Base) MCG/ACT inhaler Inhale 2 puffs into the  lungs every 4 (four) hours as needed for wheezing or shortness of breath. 12/15/23   Prescilla Sours, FNP  aspirin EC 81 MG tablet Take 81 mg by mouth daily.    [provider]  benzonatate (TESSALON) 200 MG capsule Take 1 capsule (200 mg total) by mouth 3 (three) times daily as needed for cough. 12/15/23   Prescilla Sours, FNP  escitalopram (LEXAPRO) 10 MG tablet Take by mouth. 09/19/23   [provider]  ibuprofen (ADVIL) 800 MG tablet Take 800 mg by mouth 3 (three) times daily as needed for pain or headache. 01/06/20   [provider]  promethazine-dextromethorphan (PROMETHAZINE-DM) 6.25-15 MG/5ML syrup Take 5 mLs by mouth 4 (four) times daily as needed for cough. May make  drowsy.  Do not use and drive. 12/15/23   Prescilla Sours, FNP    Family History Family History  Problem Relation Age of Onset   Cancer Mother        breast   Early death Mother    Hypertension Father    Hyperlipidemia Father    Hypertension Maternal Grandmother    Cancer Maternal Aunt    Heart disease Maternal Aunt     Social History Social History   Tobacco Use   Smoking status: Never   Smokeless tobacco: Never  Vaping Use   Vaping status: Never Used  Substance Use Topics   Alcohol use: Yes    Comment: Rare, special occasion   Drug use: Never     Allergies   Dexamethasone   Review of Systems Review of Systems  Respiratory:  Positive for cough.      Physical Exam Triage Vital Signs ED Triage Vitals  Encounter Vitals Group     BP 12/17/23 1441 (!) 145/83     Systolic BP Percentile --      Diastolic BP Percentile --      Pulse Rate 12/17/23 1441 (!) 110     Resp 12/17/23 1441 20     Temp 12/17/23 1441 98.8 F (37.1 C)     Temp Source 12/17/23 1441 Oral     SpO2 12/17/23 1441 94 %     Weight --      Height --      Head Circumference --      Peak Flow --      Pain Score 12/17/23 1442 0     Pain Loc --      Pain Education --      Exclude from Growth Chart --    No data found.  Updated Vital Signs BP (!) 145/83 (BP Location: Right Arm)   Pulse (!) 118   Temp 98.8 F (37.1 C) (Oral)   Resp 20   SpO2 95%   Visual Acuity Right Eye Distance:   Left Eye Distance:   Bilateral Distance:    Right Eye Near:   Left Eye Near:    Bilateral Near:     Physical Exam Vitals reviewed.  Constitutional:      General: He is not in acute distress.    Appearance: He is not ill-appearing, toxic-appearing or diaphoretic.  HENT:     Right Ear: Tympanic membrane and ear canal normal.     Left Ear: Tympanic membrane and ear canal normal.     Nose: Nose normal.     Mouth/Throat:     Mouth: Mucous membranes are moist.     Pharynx: No oropharyngeal exudate or  posterior oropharyngeal erythema.  Eyes:     Extraocular  Movements: Extraocular movements intact.     Conjunctiva/sclera: Conjunctivae normal.     Pupils: Pupils are equal, round, and reactive to light.  Cardiovascular:     Rate and Rhythm: Normal rate and regular rhythm.     Heart sounds: No murmur heard. Pulmonary:     Effort: No respiratory distress.     Breath sounds: No stridor.     Comments: There are expiratory wheezes that are low pitched throughout the lung fields.  No rhonchi heard.  Air movement is fair Musculoskeletal:     Cervical back: Neck supple.  Lymphadenopathy:     Cervical: No cervical adenopathy.  Skin:    Capillary Refill: Capillary refill takes less than 2 seconds.     Coloration: Skin is not jaundiced or pale.  Neurological:     General: No focal deficit present.     Mental Status: He is alert and oriented to person, place, and time.  Psychiatric:        Behavior: Behavior normal.      UC Treatments / Results  Labs (all labs ordered are listed, but only abnormal results are displayed) Labs Reviewed - No data to display  EKG   Radiology No results found.  Procedures Procedures (including critical care time)  Medications Ordered in UC Medications  ipratropium-albuterol (DUONEB) 0.5-2.5 (3) MG/3ML nebulizer solution 3 mL (3 mLs Nebulization Given 12/17/23 1503)    Initial Impression / Assessment and Plan / UC Course  I have reviewed the triage vital signs and the nursing notes.  Pertinent labs & imaging results that were available during my care of the patient were reviewed by me and considered in my medical decision making (see chart for details).    He did feel better after the DuoNeb treatment.  After that treatment his wheezing was actually louder but he was moving air better.  DuoNeb is sent in as he is discovered they do have a nebulizer he can use at home.  A longer prednisone taper is sent in and doxycycline is sent in for potential  pneumonia.  Chest x-ray is ordered and he may go get that done at [facility] since we do not have x-ray in the building yet.  He may end up coming to get that on Monday, January 20 when x-ray will start operating here at this med center.  Final Clinical Impressions(s) / UC Diagnoses   Final diagnoses:  Wheezing  Mild intermittent asthma with acute exacerbation     Discharge Instructions      Use the ipratropium-albuterol in the nebulizer every 6 hours as needed for shortness of breath or wheezing.  You can use the albuterol inhaler in between if needed.  Take prednisone 20 mg--3 tabs daily x3 days, then 2 tabs daily x3 days, then 1 tab daily x3 days, then one half tab daily x3 days, then stop  Take doxycycline 100 mg --1 capsule 2 times daily for 7 days  You can go to [facility] on [address] to get your chest x-ray today.  They are open 730-5 on Saturdays.  Or you can get it done here on January 20.       ED Prescriptions     Medication Sig Dispense Auth. Provider   ipratropium-albuterol (DUONEB) 0.5-2.5 (3) MG/3ML SOLN Take 3 mLs by nebulization every 6 (six) hours as needed. 225 mL Zenia Resides, MD   doxycycline (VIBRAMYCIN) 100 MG capsule Take 1 capsule (100 mg  total) by mouth 2 (two) times daily for 7 days. 14 capsule Kareli Hossain, Janace Aris, MD   predniSONE (DELTASONE) 20 MG tablet 3 tabs daily x3 days, then 2 tabs daily x3 days, then 1 tab daily x3 days, then one half tab daily x3 days, then stop 20 tablet Johnchristopher Sarvis, Janace Aris, MD      PDMP not reviewed this encounter.   Zenia Resides, MD 12/17/23 [phone]

## 2024-05-11 DIAGNOSIS — J984 Other disorders of lung: Secondary | ICD-10-CM | POA: Diagnosis not present

## 2024-06-12 DIAGNOSIS — F4310 Post-traumatic stress disorder, unspecified: Secondary | ICD-10-CM | POA: Diagnosis not present

## 2024-06-19 DIAGNOSIS — F4310 Post-traumatic stress disorder, unspecified: Secondary | ICD-10-CM | POA: Diagnosis not present

## 2024-07-03 DIAGNOSIS — F4310 Post-traumatic stress disorder, unspecified: Secondary | ICD-10-CM | POA: Diagnosis not present

## 2024-07-09 DIAGNOSIS — Z131 Encounter for screening for diabetes mellitus: Secondary | ICD-10-CM | POA: Diagnosis not present

## 2024-07-09 DIAGNOSIS — F4310 Post-traumatic stress disorder, unspecified: Secondary | ICD-10-CM | POA: Diagnosis not present

## 2024-07-09 DIAGNOSIS — Z130 Encounter for screening for diseases of the blood and blood-forming organs and certain disorders involving the immune mechanism: Secondary | ICD-10-CM | POA: Diagnosis not present

## 2024-07-09 DIAGNOSIS — R635 Abnormal weight gain: Secondary | ICD-10-CM | POA: Diagnosis not present

## 2024-07-09 DIAGNOSIS — E785 Hyperlipidemia, unspecified: Secondary | ICD-10-CM | POA: Diagnosis not present

## 2024-07-09 DIAGNOSIS — Z79890 Hormone replacement therapy (postmenopausal): Secondary | ICD-10-CM | POA: Diagnosis not present

## 2024-07-09 DIAGNOSIS — Z6837 Body mass index (BMI) 37.0-37.9, adult: Secondary | ICD-10-CM | POA: Diagnosis not present

## 2024-07-09 DIAGNOSIS — Z1329 Encounter for screening for other suspected endocrine disorder: Secondary | ICD-10-CM | POA: Diagnosis not present

## 2024-07-12 DIAGNOSIS — E785 Hyperlipidemia, unspecified: Secondary | ICD-10-CM | POA: Diagnosis not present

## 2024-07-12 DIAGNOSIS — F4310 Post-traumatic stress disorder, unspecified: Secondary | ICD-10-CM | POA: Diagnosis not present

## 2024-07-12 DIAGNOSIS — Z1331 Encounter for screening for depression: Secondary | ICD-10-CM | POA: Diagnosis not present

## 2024-07-12 DIAGNOSIS — F3342 Major depressive disorder, recurrent, in full remission: Secondary | ICD-10-CM | POA: Diagnosis not present

## 2024-07-12 DIAGNOSIS — E291 Testicular hypofunction: Secondary | ICD-10-CM | POA: Diagnosis not present

## 2024-07-18 DIAGNOSIS — F4310 Post-traumatic stress disorder, unspecified: Secondary | ICD-10-CM | POA: Diagnosis not present

## 2024-08-01 DIAGNOSIS — F4310 Post-traumatic stress disorder, unspecified: Secondary | ICD-10-CM | POA: Diagnosis not present

## 2024-08-10 DIAGNOSIS — F4310 Post-traumatic stress disorder, unspecified: Secondary | ICD-10-CM | POA: Diagnosis not present

## 2024-08-13 DIAGNOSIS — Z79890 Hormone replacement therapy (postmenopausal): Secondary | ICD-10-CM | POA: Diagnosis not present

## 2024-08-13 DIAGNOSIS — E291 Testicular hypofunction: Secondary | ICD-10-CM | POA: Diagnosis not present

## 2024-08-15 DIAGNOSIS — E291 Testicular hypofunction: Secondary | ICD-10-CM | POA: Diagnosis not present

## 2024-08-15 DIAGNOSIS — F4310 Post-traumatic stress disorder, unspecified: Secondary | ICD-10-CM | POA: Diagnosis not present

## 2024-08-29 DIAGNOSIS — F4310 Post-traumatic stress disorder, unspecified: Secondary | ICD-10-CM | POA: Diagnosis not present

## 2024-09-12 DIAGNOSIS — F4310 Post-traumatic stress disorder, unspecified: Secondary | ICD-10-CM | POA: Diagnosis not present

## 2024-09-21 DIAGNOSIS — F4310 Post-traumatic stress disorder, unspecified: Secondary | ICD-10-CM | POA: Diagnosis not present

## 2024-10-05 DIAGNOSIS — F4310 Post-traumatic stress disorder, unspecified: Secondary | ICD-10-CM | POA: Diagnosis not present

## 2024-11-02 DIAGNOSIS — F4310 Post-traumatic stress disorder, unspecified: Secondary | ICD-10-CM | POA: Diagnosis not present

## 2024-11-16 DIAGNOSIS — F4310 Post-traumatic stress disorder, unspecified: Secondary | ICD-10-CM | POA: Diagnosis not present

## 2024-12-11 ENCOUNTER — Encounter (HOSPITAL_COMMUNITY): Payer: Self-pay | Admitting: Family Medicine
